# Patient Record
Sex: Male | Born: 1940 | Race: White | Hispanic: No | Marital: Married | Smoking: Former smoker
Health system: Southern US, Community
[De-identification: ages and names within clinical notes are randomized; demographics above are authoritative.]

## PROBLEM LIST (undated history)

## (undated) DIAGNOSIS — Z860100 Personal history of colon polyps, unspecified: Secondary | ICD-10-CM

## (undated) DIAGNOSIS — N529 Male erectile dysfunction, unspecified: Secondary | ICD-10-CM

## (undated) DIAGNOSIS — C61 Malignant neoplasm of prostate: Secondary | ICD-10-CM

## (undated) DIAGNOSIS — H1851 Endothelial corneal dystrophy: Secondary | ICD-10-CM

## (undated) DIAGNOSIS — L719 Rosacea, unspecified: Secondary | ICD-10-CM

## (undated) DIAGNOSIS — I10 Essential (primary) hypertension: Secondary | ICD-10-CM

## (undated) DIAGNOSIS — I340 Nonrheumatic mitral (valve) insufficiency: Secondary | ICD-10-CM

## (undated) DIAGNOSIS — K219 Gastro-esophageal reflux disease without esophagitis: Secondary | ICD-10-CM

## (undated) DIAGNOSIS — G309 Alzheimer's disease, unspecified: Secondary | ICD-10-CM

## (undated) DIAGNOSIS — F0280 Dementia in other diseases classified elsewhere without behavioral disturbance: Secondary | ICD-10-CM

## (undated) DIAGNOSIS — H18519 Endothelial corneal dystrophy, unspecified eye: Secondary | ICD-10-CM

## (undated) DIAGNOSIS — G250 Essential tremor: Secondary | ICD-10-CM

## (undated) DIAGNOSIS — G8929 Other chronic pain: Secondary | ICD-10-CM

## (undated) DIAGNOSIS — M47812 Spondylosis without myelopathy or radiculopathy, cervical region: Secondary | ICD-10-CM

## (undated) DIAGNOSIS — E7800 Pure hypercholesterolemia, unspecified: Secondary | ICD-10-CM

## (undated) DIAGNOSIS — M549 Dorsalgia, unspecified: Secondary | ICD-10-CM

## (undated) DIAGNOSIS — D649 Anemia, unspecified: Secondary | ICD-10-CM

## (undated) HISTORY — DX: Gastro-esophageal reflux disease without esophagitis: K21.9

## (undated) HISTORY — DX: Essential (primary) hypertension: I10

## (undated) HISTORY — DX: Endothelial corneal dystrophy: H18.51

## (undated) HISTORY — DX: Personal history of colon polyps, unspecified: Z86.0100

## (undated) HISTORY — DX: Alzheimer's disease, unspecified: G30.9

## (undated) HISTORY — DX: Endothelial corneal dystrophy, unspecified eye: H18.519

## (undated) HISTORY — DX: Male erectile dysfunction, unspecified: N52.9

## (undated) HISTORY — DX: Gilbert syndrome: E80.4

## (undated) HISTORY — DX: Essential tremor: G25.0

## (undated) HISTORY — DX: Spondylosis without myelopathy or radiculopathy, cervical region: M47.812

## (undated) HISTORY — DX: Dorsalgia, unspecified: M54.9

## (undated) HISTORY — DX: Pure hypercholesterolemia, unspecified: E78.00

## (undated) HISTORY — DX: Nonrheumatic mitral (valve) insufficiency: I34.0

## (undated) HISTORY — DX: Malignant neoplasm of prostate: C61

## (undated) HISTORY — DX: Anemia, unspecified: D64.9

## (undated) HISTORY — DX: Other chronic pain: G89.29

## (undated) HISTORY — DX: Rosacea, unspecified: L71.9

## (undated) HISTORY — DX: Dementia in other diseases classified elsewhere, unspecified severity, without behavioral disturbance, psychotic disturbance, mood disturbance, and anxiety: F02.80

## (undated) HISTORY — DX: Personal history of colonic polyps: Z86.010

---

## 1999-02-14 ENCOUNTER — Emergency Department (HOSPITAL_COMMUNITY): Admission: EM | Admit: 1999-02-14 | Discharge: 1999-02-14 | Payer: Self-pay | Admitting: Emergency Medicine

## 1999-07-05 ENCOUNTER — Other Ambulatory Visit: Admission: RE | Admit: 1999-07-05 | Discharge: 1999-07-05 | Payer: Self-pay | Admitting: Urology

## 1999-07-27 ENCOUNTER — Encounter: Admission: RE | Admit: 1999-07-27 | Discharge: 1999-10-25 | Payer: Self-pay | Admitting: Radiation Oncology

## 1999-11-19 ENCOUNTER — Encounter: Payer: Self-pay | Admitting: Orthopedic Surgery

## 1999-11-19 ENCOUNTER — Encounter: Admission: RE | Admit: 1999-11-19 | Discharge: 1999-11-19 | Payer: Self-pay | Admitting: Orthopedic Surgery

## 1999-12-16 ENCOUNTER — Encounter: Payer: Self-pay | Admitting: Neurosurgery

## 1999-12-16 ENCOUNTER — Ambulatory Visit (HOSPITAL_COMMUNITY): Admission: RE | Admit: 1999-12-16 | Discharge: 1999-12-16 | Payer: Self-pay | Admitting: Neurosurgery

## 1999-12-23 ENCOUNTER — Encounter: Payer: Self-pay | Admitting: Neurosurgery

## 1999-12-24 ENCOUNTER — Inpatient Hospital Stay (HOSPITAL_COMMUNITY): Admission: AD | Admit: 1999-12-24 | Discharge: 1999-12-25 | Payer: Self-pay | Admitting: Neurosurgery

## 2000-01-08 ENCOUNTER — Emergency Department (HOSPITAL_COMMUNITY): Admission: EM | Admit: 2000-01-08 | Discharge: 2000-01-08 | Payer: Self-pay | Admitting: Emergency Medicine

## 2000-02-16 ENCOUNTER — Encounter: Payer: Self-pay | Admitting: Neurosurgery

## 2000-02-16 ENCOUNTER — Encounter: Admission: RE | Admit: 2000-02-16 | Discharge: 2000-02-16 | Payer: Self-pay | Admitting: Neurosurgery

## 2000-02-28 ENCOUNTER — Encounter: Payer: Self-pay | Admitting: Neurosurgery

## 2000-02-28 ENCOUNTER — Encounter: Admission: RE | Admit: 2000-02-28 | Discharge: 2000-02-28 | Payer: Self-pay | Admitting: Neurosurgery

## 2000-03-27 ENCOUNTER — Ambulatory Visit (HOSPITAL_COMMUNITY): Admission: RE | Admit: 2000-03-27 | Discharge: 2000-03-27 | Payer: Self-pay | Admitting: Neurological Surgery

## 2000-04-06 ENCOUNTER — Inpatient Hospital Stay (HOSPITAL_COMMUNITY): Admission: RE | Admit: 2000-04-06 | Discharge: 2000-04-11 | Payer: Self-pay | Admitting: Neurological Surgery

## 2000-04-06 ENCOUNTER — Encounter: Payer: Self-pay | Admitting: Neurological Surgery

## 2000-05-09 ENCOUNTER — Encounter: Admission: RE | Admit: 2000-05-09 | Discharge: 2000-05-09 | Payer: Self-pay | Admitting: Orthopaedic Surgery

## 2000-05-09 ENCOUNTER — Encounter: Payer: Self-pay | Admitting: Orthopaedic Surgery

## 2000-05-12 ENCOUNTER — Encounter: Admission: RE | Admit: 2000-05-12 | Discharge: 2000-05-12 | Payer: Self-pay | Admitting: Neurological Surgery

## 2000-05-12 ENCOUNTER — Encounter: Payer: Self-pay | Admitting: Neurological Surgery

## 2000-06-21 ENCOUNTER — Encounter: Admission: RE | Admit: 2000-06-21 | Discharge: 2000-06-21 | Payer: Self-pay | Admitting: Neurological Surgery

## 2000-06-21 ENCOUNTER — Encounter: Payer: Self-pay | Admitting: Neurological Surgery

## 2000-06-27 ENCOUNTER — Encounter: Admission: RE | Admit: 2000-06-27 | Discharge: 2000-07-14 | Payer: Self-pay | Admitting: Neurosurgery

## 2000-09-13 ENCOUNTER — Encounter (INDEPENDENT_AMBULATORY_CARE_PROVIDER_SITE_OTHER): Payer: Self-pay | Admitting: Specialist

## 2000-09-13 ENCOUNTER — Other Ambulatory Visit: Admission: RE | Admit: 2000-09-13 | Discharge: 2000-09-13 | Payer: Self-pay | Admitting: Urology

## 2001-04-07 ENCOUNTER — Encounter: Payer: Self-pay | Admitting: Neurological Surgery

## 2001-04-07 ENCOUNTER — Ambulatory Visit (HOSPITAL_COMMUNITY): Admission: RE | Admit: 2001-04-07 | Discharge: 2001-04-07 | Payer: Self-pay | Admitting: Neurological Surgery

## 2001-07-09 ENCOUNTER — Encounter (INDEPENDENT_AMBULATORY_CARE_PROVIDER_SITE_OTHER): Payer: Self-pay | Admitting: *Deleted

## 2001-07-09 ENCOUNTER — Other Ambulatory Visit: Admission: RE | Admit: 2001-07-09 | Discharge: 2001-07-09 | Payer: Self-pay | Admitting: Urology

## 2002-03-27 ENCOUNTER — Encounter: Admission: RE | Admit: 2002-03-27 | Discharge: 2002-03-27 | Payer: Self-pay | Admitting: Geriatric Medicine

## 2002-03-27 ENCOUNTER — Encounter: Payer: Self-pay | Admitting: Geriatric Medicine

## 2005-01-17 ENCOUNTER — Ambulatory Visit: Payer: Self-pay | Admitting: Gastroenterology

## 2005-02-01 ENCOUNTER — Ambulatory Visit: Payer: Self-pay | Admitting: Gastroenterology

## 2005-02-14 ENCOUNTER — Encounter: Admission: RE | Admit: 2005-02-14 | Discharge: 2005-02-14 | Payer: Self-pay | Admitting: Geriatric Medicine

## 2010-09-23 ENCOUNTER — Encounter: Admission: RE | Admit: 2010-09-23 | Discharge: 2010-09-23 | Payer: Self-pay | Admitting: Neurological Surgery

## 2011-05-13 NOTE — Discharge Summary (Signed)
[place]. [facility]  Patient:    George Garrett                         MRN: [id number] Adm. Date:  [id number] Disc. Date: [id number] Attending:  Jackelyn Knife                           Discharge Summary  HISTORY OF PRESENT ILLNESS:  Mr. George Garrett. 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  This became progressively more severe and extended back over a history of several months.  HOSPITAL COURSE:  He had a lumbosacral MRI study which demonstrated lateral stenosis on the right at L3/4 as well as a laterally situated soft disc herniation.   After informing preoperative workup including the explanation of the goals and risks of the procedure, he was taken to the operating room on December 23, 1999. He underwent a right L3-L4 hemilaminectomy and microdiskectomy.  He did well postoperatively.  He did have some trouble voiding and had to be catheterized on two occasions and this prolonged his hospital stay by one day.  FINAL DIAGNOSES: 1. Herniated intervertebral disc right lumbar vertebrae-3/4 2. Lateral spinal stenosis right lumbar vertebrae-3/4.  CONDITION ON DISCHARGE:  Improving.  DISCHARGE INSTRUCTIONS:  Up ad-lib.  Regular diet.  Back to see me in seven days.  DISCHARGE MEDICATION:  Vicodin ES 1 p.o. q.4-6h. p.r.n. pain. DD:  01/20/00 TD:  01/20/00 Job: [id number]

## 2011-05-13 NOTE — H&P (Signed)
[place]. [facility]  Patient:    George Garrett                         MRN: [id number] Adm. Date:  [id number] Attending:  Jackelyn Knife                         History and Physical  HISTORY:  George Garrett is a generally healthy 70 year old male, who presents  with a chief complaint of low back pain with radiation of pain down his right leg, primarily into the thigh.  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  Four months or so ago, he noted low back pain with some early beginnings of right leg pain hat centered around the thigh and knee.  Over the last several weeks, this has become more and more of a problem, in spite of reduced activity, heat, and analgesics.  The right leg pain at this point overshadows the amount of back pain that he is  having.  Both are made worse with activity, as well as by coughing, sneezing, or straining at bowel movement.  Normally, [name] is extremely physically active, but over the last month to ix weeks, he has done almost nothing because of the amount of pain that is produced by activity.  He was seen in consultation by Dr. Charlett Blake of orthopedics, and as part of that workup had an MRI study that demonstrated spondylosis, but also an L3-L4 disk herniation.  PAST MEDICAL HISTORY:  Pertinent in that he has some high blood pressure that had been treated effectively.  Also has a low grade prostate tumor that is being treated and followed by Dr. Darvin Neighbours.  FAMILY HISTORY:  Shows that his mother is living at age 21 with high blood pressure and Alzheimers.  His father expired per stomach cancer.  He has a 63 year old brother living and in good health, and two sister in their mid 2s living, both of them have high blood pressure.  He has two children, ages 33 and 52, living and in good health.  PREVIOUS SURGERY:  Consists of a knee operation done by Dr. Madelon Lips via  scope in 1999.  CURRENT MEDICATIONS:  Lipitor, Inderal, ______, Prinivil.  ALLERGIES:  He has no medical allergies.  SOCIAL HISTORY:  He does not smoke, does use alcohol very moderately.  REVIEW OF SYSTEMS:  Is pertinent as already mentioned.  PHYSICAL EXAMINATION:  GENERAL:  He is a healthy-appearing individual.  VITAL SIGNS:  He weighs 163 pounds and is 5 feet 10 inches tall.  Blood pressure is 147/83, pulse 63, temperature 96.7.  HEENT:  Normal.  NECK:  He has no cervical adenopathy and no cervical or supraclavicular bruits.  CHEST:  Clear.  CARDIAC:  Reveals no enlargement or bruits.  ABDOMEN:  Soft and nontender with no palpable masses.  NEUROLOGIC:  Reveals his cranial nerves to be intact.  Range of motion is limited on forward bending because of increased back and right leg pain.  He has no adenopathy or mass lesions in the back, buttocks, or legs.  He is able to walk n the heel and toe without obvious weakness, but he has difficulty doing deep knee bends because of some quadriceps weakness on the right.  This weakness can be brought out with stepping maneuvers on the right compared to the left.  He has  depressed right knee  reflex.  IMPRESSION:  Herniated disk, L3-4, right. DD:  12/23/99 TD:  12/24/99 Job: [id number]

## 2011-05-13 NOTE — Discharge Summary (Signed)
[place]. [facility]  Patient:    George Garrett, George Garrett                        MRN: [id number] Adm. Date:  [id number] Disc. Date: 04/11/00 Attending:  Jonne Ply                           Discharge Summary  ADMITTING DIAGNOSIS: Recurrent herniated nucleus pulposus, L3-4, with lumbar radiculopathy and severe spondylosis.  DISCHARGE DIAGNOSIS: Recurrent herniated nucleus pulposus, L3-4, with lumbar radiculopathy and severe spondylosis.  OPERATION/PROCEDURE: L3-4 bilateral laminotomy and diskectomy, posterior interbody arthrodesis with allograft, supplemental fixation with pedicle screws, and iliac crest bone graft at L3-4, surgeon Dr. Danielle Dess, first assistant Dr. Lovell Sheehan, performed on April 06, 2000.  DISCHARGE CONDITION: Improved.  HOSPITAL COURSE: The patient is a 70 year old individual who has had significant back and leg pain, found to have recurrent herniated nucleus pulposus and admitted to the hospital to undergo decompression in addition to stabilization as there was note of significant spondylitic degeneration at the L3-4 space.  The procedure was completed without difficulty on April 06, 2000 and postoperatively the patient had significant difficulties with urinary retention, which required placement of a urinary catheter for a period of 72 hours postoperatively.  The patient was gradually mobilized.  The incision has remained clean and dry.  His leg strength is improving and his radiculopathy is better.  He is still requiring Percocet, using up to eight per day, in addition to Xanax as a muscle relaxant.  FOLLOW-UP: He will seen in the office in approximately three to four weeks time for further follow-up. DD:  04/11/00 TD:  04/11/00 Job: [id number]

## 2011-05-13 NOTE — H&P (Signed)
[place]. [facility]  Patient:    George Garrett, George Garrett                       MRN: [id number] Adm. Date:  04/06/00 Attending:  Stefani Dama, M.D.                         History and Physical  ADMITTING DIAGNOSIS:  Recurrent herniated nucleus pulposus, L3-4, with severe right lumbar radiculopathy.  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  He noticed that a severe pain occurred in the sciatic distribution in the fall of 2000, with back pain that started rather insidiously and the leg pain radiating down the left lateral aspect of the leg into the region of his knee.  He was found to have a herniated nucleus pulposus at L3-4 and pain gradually settled on the right side.  He had a right lumbar microdiscectomy on 12/23/99 by Dr. Emilee Hero.  He was okay for the first several weeks, but subsequently developed a rather severe recrudescence of his right lower extremity.  The pain had progressively gotten worse and postoperatively an MRI showed a (re)herniation of the same disc.  He had significant collapse of the disc space on the right side, at the L3-4 level.  He developed pain in the region of the right flank and complains now of pain in the entire right lower extremity, radiating through the shin, calf and into the ankle.  Bowel and bladder control have not een affected.  He was seen in follow-up and had postoperative MRI that showed recurrence of the disc herniation in addition to significant sclerotic changes n the disc space.  Dr. Elesa Hacker recommended further surgery.  I had seen the patient on 03/22/00 for a second opinion.  I advised that he should proceed with surgical decompression and stabilization.  The patient is now admitted for this procedure under my care.  PAST MEDICAL HISTORY:  Reveals he has high blood pressure.  He has difficulties  with prostate  cancer.  He had a history of high cholesterol.  ALLERGIES:  He notes an allergy to medications.  HABITS:  He does not smoke.  He drinks alcohol on a daily basis.  His height and weight have been stable at 162 pounds.  Height 5 ft 9 in.  FAMILY HISTORY:  Mother is age 40 and in good health but she has Alzheimers. Father is deceased; no significant history is otherwise known.  There is, however, a family history of high blood pressure and Alzheimers disease, stomach cancer nd emphysema.  PAST SURGICAL HISTORY:  Herniated disc on 12/23/99.  Right knee surgery, July, 1999.  Some ligament repair in December, 1997.  CURRENT MEDICATIONS:  Lipitor, Prinivil, hydrochlorothiazide, Norvasc, Inderal,  Colace, Voltaren and Vicodin ES up to four per day for control of pain.  SYSTEMS REVIEW:  Notable for the wearing of glasses.  Use of a hearing aid. Hearing loss.  High blood pressure.  High cholesterol.  Pain pain while walking. Leg pain and weakness.  Back pain.  History of prostate cancer.  PHYSICAL EXAMINATION:  NEUROLOGIC:  He stands straight and erect without difficulty.  He flexes forward a maximum of 10-15 degrees.  His back reveals a marked amount of paravertebral spasm, particularly on the right side, with tenderness to palpation in the region on the right side in the paravertebral  lumbar musculature.  Motion, particularly flexion, worsens his pain.  Motor strength in the lower extremities reveals the iliopsoas quadriceps to be _____ anterior, have good strength to confrontation though there is modest atrophy in the right quad, noted at this time.  Deep tendon reflexes absent in the right patellae, 2+ in the left patellae, 2+ in both Achilles, Babinskis downgoing.  Straight leg raising is markedly positive on the right side at 30 degrees and negative on the left side to 80 degrees.  Patricks maneuver is positive on the right side for back pain and leg pain; negative on the  left side. Sensation is diminished at the level of the knee to pin and vibration.  Cranial  nerve exam reveals that the pupils are 4 mm, briskly reactive to light and accommodation; extraocular movements are full.  The face is symmetric to grimace. Tongue and uvula are in the midline.  Sclerae and conjunctivae are clear.  LUNGS:  Clear to auscultation.  HEART:  Regular rate and rhythm.  ABDOMEN:  Soft, bowel sounds are positive.  No masses are palpable.  EXTREMITIES:  Reveal no cyanosis, clubbing or edema.  IMPRESSION:  The patient has a recurrent herniated nucleus pulposus at L3-4 with a right lumbar radiculopathy.  PLAN:  He is now being admitted to undergo repeat discectomy for ______ decompression and stabilization of his lumbar spine. DD:  04/06/00 TD:  04/06/00 Job: [id number]

## 2011-05-13 NOTE — Op Note (Signed)
[place]. [facility]  Patient:    George Garrett                         MRN: [id number] Proc. Date: 12/23/99 Adm. 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  POSTOPERATIVE DIAGNOSES: 1. 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 2. Spondylosis and lateral recess stenosis.  OPERATION:  Right L3-L4 hemilaminotomy, foraminotomy, and discectomy.  SURGEON:  Izell Linwood. Elesa Hacker, M.D.  ASSISTANT:  Clydene Fake, M.D.  DESCRIPTION OF PROCEDURE:  Under general endotracheal anesthesia, the usual subperiosteal approach was carried out to the right L3-L4 intralaminar space. Using the operating microscope and high speed drill, a hemilaminotomy was performed.  Because of spondylitic overgrowth and thickened ligaments, there was noted to be an element of spinal stenosis.  These hypertrophied and calcified ligaments and also bone spurs were removed and the intervertebral disc space was identified and noted to be herniated.  The annulus was incised and a large quantity of degenerative disc disease material was removed including a large piece that was situated laterally which may have een partially extruded.  This afforded excellent decompression of the dural sac and  nerve root.  The wound was copiously irrigated with antibiotic solution and then closed in layers.  A sterile dressing was applied and the patient was taken to the recovery room in good condition. DD:  12/23/99 TD:  12/25/99 Job: [id number]

## 2012-01-18 ENCOUNTER — Encounter: Payer: Self-pay | Admitting: Internal Medicine

## 2012-09-13 ENCOUNTER — Encounter: Payer: Self-pay | Admitting: Internal Medicine

## 2012-10-04 ENCOUNTER — Encounter: Payer: Self-pay | Admitting: Gastroenterology

## 2014-08-09 ENCOUNTER — Encounter: Payer: Self-pay | Admitting: *Deleted

## 2015-08-17 ENCOUNTER — Encounter (HOSPITAL_COMMUNITY): Payer: Self-pay | Admitting: Emergency Medicine

## 2015-08-17 ENCOUNTER — Emergency Department (HOSPITAL_COMMUNITY)
Admission: EM | Admit: 2015-08-17 | Discharge: 2015-08-17 | Disposition: A | Discharge status: Home / Self Care | Payer: Medicare PPO | Attending: Emergency Medicine | Admitting: Emergency Medicine

## 2015-08-17 ENCOUNTER — Emergency Department (HOSPITAL_COMMUNITY): Payer: Medicare PPO

## 2015-08-17 DIAGNOSIS — Z8719 Personal history of other diseases of the digestive system: Secondary | ICD-10-CM | POA: Insufficient documentation

## 2015-08-17 DIAGNOSIS — Z7982 Long term (current) use of aspirin: Secondary | ICD-10-CM | POA: Insufficient documentation

## 2015-08-17 DIAGNOSIS — Y9289 Other specified places as the place of occurrence of the external cause: Secondary | ICD-10-CM | POA: Diagnosis not present

## 2015-08-17 DIAGNOSIS — G8929 Other chronic pain: Secondary | ICD-10-CM | POA: Diagnosis not present

## 2015-08-17 DIAGNOSIS — Z87438 Personal history of other diseases of male genital organs: Secondary | ICD-10-CM | POA: Insufficient documentation

## 2015-08-17 DIAGNOSIS — G309 Alzheimer's disease, unspecified: Secondary | ICD-10-CM | POA: Insufficient documentation

## 2015-08-17 DIAGNOSIS — Y9389 Activity, other specified: Secondary | ICD-10-CM | POA: Insufficient documentation

## 2015-08-17 DIAGNOSIS — S80811A Abrasion, right lower leg, initial encounter: Secondary | ICD-10-CM | POA: Insufficient documentation

## 2015-08-17 DIAGNOSIS — I10 Essential (primary) hypertension: Secondary | ICD-10-CM | POA: Diagnosis not present

## 2015-08-17 DIAGNOSIS — R001 Bradycardia, unspecified: Secondary | ICD-10-CM | POA: Insufficient documentation

## 2015-08-17 DIAGNOSIS — Y998 Other external cause status: Secondary | ICD-10-CM | POA: Diagnosis not present

## 2015-08-17 DIAGNOSIS — S40812A Abrasion of left upper arm, initial encounter: Secondary | ICD-10-CM | POA: Diagnosis not present

## 2015-08-17 DIAGNOSIS — W01198A Fall on same level from slipping, tripping and stumbling with subsequent striking against other object, initial encounter: Secondary | ICD-10-CM | POA: Diagnosis not present

## 2015-08-17 DIAGNOSIS — Z86010 Personal history of colonic polyps: Secondary | ICD-10-CM | POA: Diagnosis not present

## 2015-08-17 DIAGNOSIS — Z79899 Other long term (current) drug therapy: Secondary | ICD-10-CM | POA: Diagnosis not present

## 2015-08-17 DIAGNOSIS — S80812A Abrasion, left lower leg, initial encounter: Secondary | ICD-10-CM | POA: Insufficient documentation

## 2015-08-17 DIAGNOSIS — S40811A Abrasion of right upper arm, initial encounter: Secondary | ICD-10-CM | POA: Diagnosis not present

## 2015-08-17 DIAGNOSIS — Z8546 Personal history of malignant neoplasm of prostate: Secondary | ICD-10-CM | POA: Diagnosis not present

## 2015-08-17 DIAGNOSIS — Z872 Personal history of diseases of the skin and subcutaneous tissue: Secondary | ICD-10-CM | POA: Insufficient documentation

## 2015-08-17 DIAGNOSIS — Z862 Personal history of diseases of the blood and blood-forming organs and certain disorders involving the immune mechanism: Secondary | ICD-10-CM | POA: Insufficient documentation

## 2015-08-17 DIAGNOSIS — Z87891 Personal history of nicotine dependence: Secondary | ICD-10-CM | POA: Diagnosis not present

## 2015-08-17 DIAGNOSIS — F0280 Dementia in other diseases classified elsewhere without behavioral disturbance: Secondary | ICD-10-CM | POA: Diagnosis not present

## 2015-08-17 DIAGNOSIS — W19XXXA Unspecified fall, initial encounter: Secondary | ICD-10-CM

## 2015-08-17 NOTE — ED Notes (Signed)
Pt came from a SNF where he was standing while holding a chair and fell. Pt hit his head and the SNF sent him to ED for eval. Family at bedside and stated he is acting per his normal. No distress noted.

## 2015-08-17 NOTE — ED Provider Notes (Signed)
CSN: [id number]     Arrival date & time 08/17/15  9024 History   First MD Initiated Contact with Patient 08/17/15 [phone]     Chief Complaint  Patient presents with  . Fall     (Consider location/radiation/quality/duration/timing/severity/associated sxs/prior Treatment) Patient is a 74 y.o. male presenting with fall. The history is provided by the patient.  Fall This is a new problem. The current episode started less than 1 hour ago. The problem occurs constantly. The problem has not changed since onset.Pertinent negatives include no chest pain, no abdominal pain, no headaches and no shortness of breath. Nothing aggravates the symptoms. Nothing relieves the symptoms. He has tried nothing for the symptoms. The treatment provided no relief.   74 yo M with a chief complaint of fall. Per the family he has been doing this more more often or he starts looking around the room becomes confused bends over and then falls out of his chair. Happened earlier today was witnessed by the nursing home staff denies syncope. Patient pleasantly demented and refuses areas of pain.  Moving all 4 extremities mild abrasion to the posterior occiput otherwise no signs of trauma.  Past Medical History  Diagnosis Date  . Hypertension   . Chronic back pain   . Benign familial tremor   . Normocytic normochromic anemia   . Prostate cancer   . Hypercholesteremia   . Fuchs' corneal dystrophy   . Mild mitral regurgitation   . ED (erectile dysfunction)   . Cervical spondylosis   . Gilbert's syndrome   . History of colon polyps   . Rosacea   . Alzheimer's dementia   . GERD (gastroesophageal reflux disease)    History reviewed. No pertinent past surgical history. Family History  Problem Relation Age of Onset  . Heart attack Mother   . Alzheimer's disease Mother   . Hypertension Sister   . Hypertension Brother   . Alzheimer's disease Maternal Aunt   . Alzheimer's disease Maternal Grandmother    Social History   Substance Use Topics  . Smoking status: Former Smoker    Quit date: 12/26/1993  . Smokeless tobacco: None  . Alcohol Use: 4.2 oz/week    7 Glasses of wine per week    Review of Systems  Constitutional: Negative for fever and chills.  HENT: Negative for congestion and facial swelling.   Eyes: Negative for discharge and visual disturbance.  Respiratory: Negative for shortness of breath.   Cardiovascular: Negative for chest pain and palpitations.  Gastrointestinal: Negative for vomiting, abdominal pain and diarrhea.  Musculoskeletal: Negative for myalgias and arthralgias.  Skin: Negative for color change and rash.  Neurological: Negative for tremors, syncope and headaches.  Psychiatric/Behavioral: Negative for confusion and dysphoric mood.      Allergies  Aricept; Crestor; Flomax; Lipitor; Maxzide; Pravachol; Sertraline; Simvastatin; Welchol; and Zetia  Home Medications   Prior to Admission medications   Medication Sig Start Date End Date Taking? Authorizing Provider  acetaminophen (TYLENOL) 500 MG tablet Take 1,000 mg by mouth daily as needed for mild pain or moderate pain.   Yes Historical Provider, MD  amLODipine (NORVASC) 5 MG tablet Take 5 mg by mouth daily.   Yes Historical Provider, MD  aspirin 81 MG tablet Take 81 mg by mouth daily.   Yes Historical Provider, MD  divalproex (DEPAKOTE SPRINKLE) 125 MG capsule Take 250-375 mg by mouth 2 (two) times daily. Take 250 mg by mouth every morning  & 375 mg by mouth every evening  Yes Historical Provider, MD  escitalopram (LEXAPRO) 20 MG tablet Take 20 mg by mouth daily.   Yes Historical Provider, MD  lisinopril (PRINIVIL,ZESTRIL) 10 MG tablet Take 10 mg by mouth 2 (two) times daily.   Yes Historical Provider, MD  LORazepam (ATIVAN) 0.5 MG tablet Take 0.5 mg by mouth 2 (two) times daily.    Yes Historical Provider, MD  Memantine HCl ER (NAMENDA XR) 28 MG CP24 Take 28 mg by mouth daily.    Yes Historical Provider, MD  metoprolol  tartrate (LOPRESSOR) 25 MG tablet Take 25 mg by mouth 2 (two) times daily.   Yes Historical Provider, MD  polyethylene glycol (MIRALAX / GLYCOLAX) packet Take 17 g by mouth daily as needed for mild constipation or moderate constipation.    Yes Historical Provider, MD  QUEtiapine (SEROQUEL) 25 MG tablet Take 25 mg by mouth at bedtime.   Yes Historical Provider, MD  silodosin (RAPAFLO) 8 MG CAPS capsule Take 8 mg by mouth daily.    Yes Historical Provider, MD   BP 133/59 mmHg  Pulse 45  Temp(Src) 97.7 F (36.5 C) (Oral)  Resp 14  SpO2 100% Physical Exam  Constitutional: He appears well-developed and well-nourished.  HENT:  Head: Normocephalic and atraumatic.  Eyes: EOM are normal. Pupils are equal, round, and reactive to light.  Neck: Normal range of motion. Neck supple. No JVD present.  Cardiovascular: Regular rhythm.  Bradycardia present.  Exam reveals no gallop and no friction rub.   No murmur heard. Pulmonary/Chest: No respiratory distress. He has no wheezes.  Abdominal: He exhibits no distension. There is no rebound and no guarding.  Musculoskeletal: Normal range of motion.  Neurological: He is alert. GCS eye subscore is 4. GCS verbal subscore is 4. GCS motor subscore is 6.  Skin: No rash noted. No pallor.  Psychiatric: He has a normal mood and affect. His behavior is normal.    ED Course  Procedures (including critical care time) Labs Review Labs Reviewed - No data to display  Imaging Review Ct Head Wo Contrast  08/17/2015   CLINICAL DATA:  Fall, head trauma. Vertex scalp laceration. Alzheimer's disease.  EXAM: CT HEAD WITHOUT CONTRAST  CT CERVICAL SPINE WITHOUT CONTRAST  TECHNIQUE: Multidetector CT imaging of the head and cervical spine was performed following the standard protocol without intravenous contrast. Multiplanar CT image reconstructions of the cervical spine were also generated.  COMPARISON:  Cervical spine MRI 06/25/2010.  FINDINGS: CT HEAD FINDINGS  Mild cortical  volume loss noted with proportional ventricular prominence. Areas of periventricular white matter hypodensity are most compatible with small vessel ischemic change. No acute hemorrhage, infarct, or mass lesion is identified. No skull fracture. Orbits and paranasal sinuses are unremarkable.  CT CERVICAL SPINE FINDINGS  Minimal anterior wedging at T1 is stable. No fracture or dislocation. No precervical soft tissue widening. Normal alignment. Minimal disc degenerative change is identified at multiple levels, producing mild impingement upon the neural foramina by uncovertebral joint hypertrophy/ spurring from C3 through C7 as well as facet osteoarthritic change at these levels. Multilevel disc degenerative change noted with loss of disc space and vacuum disc phenomenon at C4-C5, C5-C6, and C6-C7.  IMPRESSION: No acute intracranial abnormality.  Mild mid cervical degenerative change without fracture or dislocation.   Electronically Signed   By: Conchita Paris M.D.   On: 08/17/2015 10:29   Ct Cervical Spine Wo Contrast  08/17/2015   CLINICAL DATA:  Fall, head trauma. Vertex scalp laceration. Alzheimer's disease.  EXAM: CT  HEAD WITHOUT CONTRAST  CT CERVICAL SPINE WITHOUT CONTRAST  TECHNIQUE: Multidetector CT imaging of the head and cervical spine was performed following the standard protocol without intravenous contrast. Multiplanar CT image reconstructions of the cervical spine were also generated.  COMPARISON:  Cervical spine MRI 06/25/2010.  FINDINGS: CT HEAD FINDINGS  Mild cortical volume loss noted with proportional ventricular prominence. Areas of periventricular white matter hypodensity are most compatible with small vessel ischemic change. No acute hemorrhage, infarct, or mass lesion is identified. No skull fracture. Orbits and paranasal sinuses are unremarkable.  CT CERVICAL SPINE FINDINGS  Minimal anterior wedging at T1 is stable. No fracture or dislocation. No precervical soft tissue widening. Normal  alignment. Minimal disc degenerative change is identified at multiple levels, producing mild impingement upon the neural foramina by uncovertebral joint hypertrophy/ spurring from C3 through C7 as well as facet osteoarthritic change at these levels. Multilevel disc degenerative change noted with loss of disc space and vacuum disc phenomenon at C4-C5, C5-C6, and C6-C7.  IMPRESSION: No acute intracranial abnormality.  Mild mid cervical degenerative change without fracture or dislocation.   Electronically Signed   By: Conchita Paris M.D.   On: 08/17/2015 10:29   I have personally reviewed and evaluated these images and lab results as part of my medical decision-making.   EKG Interpretation   Date/Time:  Monday August 17 2015 09:36:09 EDT Ventricular Rate:  43 PR Interval:  168 QRS Duration: 118 QT Interval:  493 QTC Calculation: 417 R Axis:   -11 Text Interpretation:  Sinus bradycardia Incomplete right bundle branch  block No significant change since last tracing Confirmed by Torianne Laflam MD,  Quillian Quince ([id number]) on 08/17/2015 10:03:47 AM      MDM   Final diagnoses:  Fall, initial encounter    74 yo M with a chief complaint of a fall. Appears to be mechanical by history. Patient currently at baseline according to family. CT head C-spine. EKG without significant changes from prior.  CT head and c spine unremarkable and viewed by me.  Patient continues to be at baseline.  Will d/c home.   I have discussed the diagnosis/risks/treatment options with the patient and family and believe the pt to be eligible for discharge home to follow-up with PCP. We also discussed returning to the ED immediately if new or worsening sx occur. We discussed the sx which are most concerning (e.g., sudden neuro deficit) that necessitate immediate return. Medications administered to the patient during their visit and any new prescriptions provided to the patient are listed below.  Medications given during this  visit Medications - No data to display  Discharge Medication List as of 08/17/2015 10:36 AM       The patient appears reasonably screen and/or stabilized for discharge and I doubt any other medical condition or other [facility] requiring further screening, evaluation, or treatment in the ED at this time prior to discharge.    Deno Etienne, DO 08/17/15 1756

## 2015-08-17 NOTE — Discharge Instructions (Signed)

## 2015-08-17 NOTE — ED Notes (Signed)
Awake. Verbally responsive. A/O x1. Resp even and unlabored. No audible adventitious breath sounds noted. ABC's intact.  

## 2015-08-17 NOTE — ED Notes (Signed)
Bed: YK99 Expected date:  Expected time:  Means of arrival:  Comments: EMS- 74yo M, fall/hit head, no thinners

## 2015-09-01 ENCOUNTER — Encounter (HOSPITAL_COMMUNITY): Payer: Self-pay | Admitting: *Deleted

## 2015-09-01 ENCOUNTER — Emergency Department (HOSPITAL_COMMUNITY)
Admission: EM | Admit: 2015-09-01 | Discharge: 2015-09-01 | Disposition: A | Discharge status: Home / Self Care | Payer: Medicare PPO | Attending: Emergency Medicine | Admitting: Emergency Medicine

## 2015-09-01 ENCOUNTER — Emergency Department (HOSPITAL_COMMUNITY): Payer: Medicare PPO

## 2015-09-01 DIAGNOSIS — Y92128 Other place in nursing home as the place of occurrence of the external cause: Secondary | ICD-10-CM | POA: Insufficient documentation

## 2015-09-01 DIAGNOSIS — W1839XA Other fall on same level, initial encounter: Secondary | ICD-10-CM | POA: Insufficient documentation

## 2015-09-01 DIAGNOSIS — I10 Essential (primary) hypertension: Secondary | ICD-10-CM | POA: Insufficient documentation

## 2015-09-01 DIAGNOSIS — Z87891 Personal history of nicotine dependence: Secondary | ICD-10-CM | POA: Insufficient documentation

## 2015-09-01 DIAGNOSIS — S0081XA Abrasion of other part of head, initial encounter: Secondary | ICD-10-CM | POA: Diagnosis not present

## 2015-09-01 DIAGNOSIS — Z8719 Personal history of other diseases of the digestive system: Secondary | ICD-10-CM | POA: Insufficient documentation

## 2015-09-01 DIAGNOSIS — Y998 Other external cause status: Secondary | ICD-10-CM | POA: Insufficient documentation

## 2015-09-01 DIAGNOSIS — F0280 Dementia in other diseases classified elsewhere without behavioral disturbance: Secondary | ICD-10-CM | POA: Diagnosis not present

## 2015-09-01 DIAGNOSIS — Z8546 Personal history of malignant neoplasm of prostate: Secondary | ICD-10-CM | POA: Insufficient documentation

## 2015-09-01 DIAGNOSIS — S0990XA Unspecified injury of head, initial encounter: Secondary | ICD-10-CM

## 2015-09-01 DIAGNOSIS — Z79899 Other long term (current) drug therapy: Secondary | ICD-10-CM | POA: Insufficient documentation

## 2015-09-01 DIAGNOSIS — Y9389 Activity, other specified: Secondary | ICD-10-CM | POA: Diagnosis not present

## 2015-09-01 DIAGNOSIS — Z86010 Personal history of colonic polyps: Secondary | ICD-10-CM | POA: Diagnosis not present

## 2015-09-01 DIAGNOSIS — W19XXXA Unspecified fall, initial encounter: Secondary | ICD-10-CM

## 2015-09-01 DIAGNOSIS — Z862 Personal history of diseases of the blood and blood-forming organs and certain disorders involving the immune mechanism: Secondary | ICD-10-CM | POA: Diagnosis not present

## 2015-09-01 DIAGNOSIS — S50812A Abrasion of left forearm, initial encounter: Secondary | ICD-10-CM | POA: Diagnosis not present

## 2015-09-01 DIAGNOSIS — G8929 Other chronic pain: Secondary | ICD-10-CM | POA: Diagnosis not present

## 2015-09-01 DIAGNOSIS — T148XXA Other injury of unspecified body region, initial encounter: Secondary | ICD-10-CM

## 2015-09-01 DIAGNOSIS — Z8639 Personal history of other endocrine, nutritional and metabolic disease: Secondary | ICD-10-CM | POA: Diagnosis not present

## 2015-09-01 DIAGNOSIS — Z7982 Long term (current) use of aspirin: Secondary | ICD-10-CM | POA: Diagnosis not present

## 2015-09-01 DIAGNOSIS — Z8739 Personal history of other diseases of the musculoskeletal system and connective tissue: Secondary | ICD-10-CM | POA: Insufficient documentation

## 2015-09-01 DIAGNOSIS — G309 Alzheimer's disease, unspecified: Secondary | ICD-10-CM | POA: Diagnosis not present

## 2015-09-01 DIAGNOSIS — Z872 Personal history of diseases of the skin and subcutaneous tissue: Secondary | ICD-10-CM | POA: Insufficient documentation

## 2015-09-01 NOTE — ED Notes (Signed)
Bed: WA21 Expected date:  Expected time:  Means of arrival:  Comments: 

## 2015-09-01 NOTE — ED Notes (Signed)
Patient transported to CT 

## 2015-09-01 NOTE — ED Provider Notes (Signed)
CSN: [id number]     Arrival date & time 09/01/15  1048 History   First MD Initiated Contact with Patient 09/01/15 1108     Chief Complaint  Patient presents with  . Fall     (Consider location/radiation/quality/duration/timing/severity/associated sxs/prior Treatment) HPI Comments: Level 5 caveat Ems reports that pt fell at nursing facility today. usure of how he fell.per nursing home pt is at his baseline. Pt was ambulatory at the scene. Abrasion to the left arm is not new. Abrasion to the forehead is new  The history is provided by the EMS personnel. No language interpreter was used.    Past Medical History  Diagnosis Date  . Hypertension   . Chronic back pain   . Benign familial tremor   . Normocytic normochromic anemia   . Prostate cancer   . Hypercholesteremia   . Fuchs' corneal dystrophy   . Mild mitral regurgitation   . ED (erectile dysfunction)   . Cervical spondylosis   . Gilbert's syndrome   . History of colon polyps   . Rosacea   . Alzheimer's dementia   . GERD (gastroesophageal reflux disease)    History reviewed. No pertinent past surgical history. Family History  Problem Relation Age of Onset  . Heart attack Mother   . Alzheimer's disease Mother   . Hypertension Sister   . Hypertension Brother   . Alzheimer's disease Maternal Aunt   . Alzheimer's disease Maternal Grandmother    Social History  Substance Use Topics  . Smoking status: Former Smoker    Quit date: 12/26/1993  . Smokeless tobacco: None  . Alcohol Use: 4.2 oz/week    7 Glasses of wine per week    Review of Systems  Unable to perform ROS: Dementia      Allergies  Aricept; Crestor; Flomax; Lipitor; Maxzide; Pravachol; Sertraline; Simvastatin; Welchol; and Zetia  Home Medications   Prior to Admission medications   Medication Sig Start Date End Date Taking? Authorizing Provider  acetaminophen (TYLENOL) 500 MG tablet Take 1,000 mg by mouth daily as needed for mild pain or moderate  pain.   Yes Historical Provider, MD  amLODipine (NORVASC) 5 MG tablet Take 5 mg by mouth 2 (two) times daily.    Yes Historical Provider, MD  aspirin 81 MG tablet Take 81 mg by mouth daily.   Yes Historical Provider, MD  divalproex (DEPAKOTE SPRINKLE) 125 MG capsule Take 250-375 mg by mouth 2 (two) times daily. Take 250 mg by mouth every morning  & 375 mg by mouth every evening   Yes Historical Provider, MD  escitalopram (LEXAPRO) 20 MG tablet Take 20 mg by mouth daily.   Yes Historical Provider, MD  lisinopril (PRINIVIL,ZESTRIL) 10 MG tablet Take 10 mg by mouth 2 (two) times daily.   Yes Historical Provider, MD  LORazepam (ATIVAN) 0.5 MG tablet Take 0.5 mg by mouth 2 (two) times daily.    Yes Historical Provider, MD  Memantine HCl ER (NAMENDA XR) 28 MG CP24 Take 28 mg by mouth daily.    Yes Historical Provider, MD  metoprolol tartrate (LOPRESSOR) 25 MG tablet Take 25 mg by mouth 2 (two) times daily.   Yes Historical Provider, MD  polyethylene glycol (MIRALAX / GLYCOLAX) packet Take 17 g by mouth daily as needed for mild constipation or moderate constipation.    Yes Historical Provider, MD  QUEtiapine (SEROQUEL) 25 MG tablet Take 25 mg by mouth at bedtime.   Yes Historical Provider, MD  silodosin (RAPAFLO) 8 MG CAPS  capsule Take 8 mg by mouth daily.    Yes Historical Provider, MD   BP 149/74 mmHg  Pulse 45  Temp(Src) 97.4 F (36.3 C) (Oral)  Resp 16  SpO2 100% Physical Exam  Constitutional: He appears well-developed and well-nourished.  HENT:  Right Ear: External ear normal.  Left Ear: External ear normal.  Abrasion to the forehead  Eyes: Conjunctivae and EOM are normal. Pupils are equal, round, and reactive to light.  Pulmonary/Chest: Effort normal and breath sounds normal.  Abdominal: Soft. Bowel sounds are normal. There is no tenderness.  Musculoskeletal: Normal range of motion.  Moving all extremities without any problems. Pt is ambulatory  Neurological: He is alert. He exhibits  normal muscle tone. Coordination normal.  Skin:  Abrasion to the left forearm  Nursing note and vitals reviewed.   ED Course  Procedures (including critical care time) Labs Review Labs Reviewed - No data to display  Imaging Review Ct Head Wo Contrast  09/01/2015   CLINICAL DATA:  Fall, history of prostate cancer  EXAM: CT HEAD WITHOUT CONTRAST  CT CERVICAL SPINE WITHOUT CONTRAST  TECHNIQUE: Multidetector CT imaging of the head and cervical spine was performed following the standard protocol without intravenous contrast. Multiplanar CT image reconstructions of the cervical spine were also generated.  COMPARISON:  08/17/2015  FINDINGS: CT HEAD FINDINGS  No skull fracture is noted. Paranasal sinuses and mastoid air cells are unremarkable.  No intracranial hemorrhage, mass effect or midline shift. Stable atrophy and chronic white matter disease. No acute cortical infarction. No mass lesion is noted on this unenhanced scan.  CT CERVICAL SPINE FINDINGS  Axial images of the cervical spine shows no acute fracture or subluxation. Computer processed images shows no acute fracture or subluxation. There is diffuse osteopenia. Degenerative changes are noted C1-C2 articulation. There is mild disc space flattening with mild posterior spurring at C3-C4 level. Significant disc space flattening with mild anterior mild posterior spurring at C4-C5, C5-C6 and C6-C7 level. No prevertebral soft tissue swelling. Cervical airway is patent.  Multilevel facet degenerative changes are noted. Atherosclerotic calcifications of carotid bifurcation. There is no pneumothorax in visualized lung apices.  IMPRESSION: 1. No acute intracranial abnormality. Stable atrophy and chronic white matter disease. 2. No cervical spine acute fracture or subluxation. Multilevel degenerative changes as described above. 3. Atherosclerotic calcifications of carotid siphon and carotid bifurcation bilaterally.   Electronically Signed   By: Lahoma Crocker M.D.    On: 09/01/2015 12:19   Ct Cervical Spine Wo Contrast  09/01/2015   CLINICAL DATA:  Fall, history of prostate cancer  EXAM: CT HEAD WITHOUT CONTRAST  CT CERVICAL SPINE WITHOUT CONTRAST  TECHNIQUE: Multidetector CT imaging of the head and cervical spine was performed following the standard protocol without intravenous contrast. Multiplanar CT image reconstructions of the cervical spine were also generated.  COMPARISON:  08/17/2015  FINDINGS: CT HEAD FINDINGS  No skull fracture is noted. Paranasal sinuses and mastoid air cells are unremarkable.  No intracranial hemorrhage, mass effect or midline shift. Stable atrophy and chronic white matter disease. No acute cortical infarction. No mass lesion is noted on this unenhanced scan.  CT CERVICAL SPINE FINDINGS  Axial images of the cervical spine shows no acute fracture or subluxation. Computer processed images shows no acute fracture or subluxation. There is diffuse osteopenia. Degenerative changes are noted C1-C2 articulation. There is mild disc space flattening with mild posterior spurring at C3-C4 level. Significant disc space flattening with mild anterior mild posterior spurring at C4-C5, C5-C6  and C6-C7 level. No prevertebral soft tissue swelling. Cervical airway is patent.  Multilevel facet degenerative changes are noted. Atherosclerotic calcifications of carotid bifurcation. There is no pneumothorax in visualized lung apices.  IMPRESSION: 1. No acute intracranial abnormality. Stable atrophy and chronic white matter disease. 2. No cervical spine acute fracture or subluxation. Multilevel degenerative changes as described above. 3. Atherosclerotic calcifications of carotid siphon and carotid bifurcation bilaterally.   Electronically Signed   By: Lahoma Crocker M.D.   On: 09/01/2015 12:19   I have personally reviewed and evaluated these images and lab results as part of my medical decision-making.   EKG Interpretation None      MDM   Final diagnoses:  Fall,  initial encounter  Abrasion  Head injury, initial encounter    No acute brain or bony abnormality noted. Pt is at his baseline.bradycardia noted. Looks like this is similar to previous visit and is his baseline    Glendell Docker, NP 09/01/15 [place], MD 09/02/15 [phone]

## 2015-09-01 NOTE — ED Notes (Signed)
Pt placed on bed alarm due to confusion and climbing out of bed

## 2015-09-01 NOTE — Discharge Instructions (Signed)
Abrasion °An abrasion is a cut or scrape of the skin. Abrasions do not extend through all layers of the skin and most heal within 10 days. It is important to care for your abrasion properly to prevent infection. °CAUSES  °Most abrasions are caused by falling on, or gliding across, the ground or other surface. When your skin rubs on something, the outer and inner layer of skin rubs off, causing an abrasion. °DIAGNOSIS  °Your caregiver will be able to diagnose an abrasion during a physical exam.  °TREATMENT  °Your treatment depends on how large and deep the abrasion is. Generally, your abrasion will be cleaned with water and a mild soap to remove any dirt or debris. An antibiotic ointment may be put over the abrasion to prevent an infection. A bandage (dressing) may be wrapped around the abrasion to keep it from getting dirty.  °You may need a tetanus shot if: °· You cannot remember when you had your last tetanus shot. °· You have never had a tetanus shot. °· The injury broke your skin. °If you get a tetanus shot, your arm may swell, get red, and feel warm to the touch. This is common and not a problem. If you need a tetanus shot and you choose not to have one, there is a rare chance of getting tetanus. Sickness from tetanus can be serious.  °HOME CARE INSTRUCTIONS  °· If a dressing was applied, change it at least once a day or as directed by your caregiver. If the bandage sticks, soak it off with warm water.   °· Wash the area with water and a mild soap to remove all the ointment 2 times a day. Rinse off the soap and pat the area dry with a clean towel.   °· Reapply any ointment as directed by your caregiver. This will help prevent infection and keep the bandage from sticking. Use gauze over the wound and under the dressing to help keep the bandage from sticking.   °· Change your dressing right away if it becomes wet or dirty.   °· Only take over-the-counter or prescription medicines for pain, discomfort, or fever as  directed by your caregiver.   °· Follow up with your caregiver within 24-48 hours for a wound check, or as directed. If you were not given a wound-check appointment, look closely at your abrasion for redness, swelling, or pus. These are signs of infection. °SEEK IMMEDIATE MEDICAL CARE IF:  °· You have increasing pain in the wound.   °· You have redness, swelling, or tenderness around the wound.   °· You have pus coming from the wound.   °· You have a fever or persistent symptoms for more than 2-3 days. °· You have a fever and your symptoms suddenly get worse. °· You have a bad smell coming from the wound or dressing.   °MAKE SURE YOU:  °· Understand these instructions. °· Will watch your condition. °· Will get help right away if you are not doing well or get worse. °Document Released: 09/21/2005 Document Revised: 11/28/2012 Document Reviewed: 11/15/2011 °Exit[employer]® Patient Information ©2015 [employer]. This information is not intended to replace advice given to you by your health care provider. Make sure you discuss any questions you have with your health care provider. ° °Fall Prevention and Home Safety °Falls cause injuries and can affect all age groups. It is possible to use preventive measures to significantly decrease the likelihood of falls. There are many simple measures which can make your home safer and prevent falls. °OUTDOORS °· Repair   cracks and edges of walkways and driveways. °· Remove high doorway thresholds. °· Trim shrubbery on the main path into your home. °· Have good outside lighting. °· Clear walkways of tools, rocks, debris, and clutter. °· Check that handrails are not broken and are securely fastened. Both sides of steps should have handrails. °· Have leaves, snow, and ice cleared regularly. °· Use sand or salt on walkways during winter months. °· In the garage, clean up grease or oil spills. °BATHROOM °· Install night lights. °· Install grab bars by the toilet and in the tub and  shower. °· Use non-skid mats or decals in the tub or shower. °· Place a plastic non-slip stool in the shower to sit on, if needed. °· Keep floors dry and clean up all water on the floor immediately. °· Remove soap buildup in the tub or shower on a regular basis. °· Secure bath mats with non-slip, double-sided rug tape. °· Remove throw rugs and tripping hazards from the floors. °BEDROOMS °· Install night lights. °· Make sure a bedside light is easy to reach. °· Do not use oversized bedding. °· Keep a telephone by your bedside. °· Have a firm chair with side arms to use for getting dressed. °· Remove throw rugs and tripping hazards from the floor. °KITCHEN °· Keep handles on pots and pans turned toward the center of the stove. Use back burners when possible. °· Clean up spills quickly and allow time for drying. °· Avoid walking on wet floors. °· Avoid hot utensils and knives. °· Position shelves so they are not too high or low. °· Place commonly used objects within easy reach. °· If necessary, use a sturdy step stool with a grab bar when reaching. °· Keep electrical cables out of the way. °· Do not use floor polish or wax that makes floors slippery. If you must use wax, use non-skid floor wax. °· Remove throw rugs and tripping hazards from the floor. °STAIRWAYS °· Never leave objects on stairs. °· Place handrails on both sides of stairways and use them. Fix any loose handrails. Make sure handrails on both sides of the stairways are as long as the stairs. °· Check carpeting to make sure it is firmly attached along stairs. Make repairs to worn or loose carpet promptly. °· Avoid placing throw rugs at the top or bottom of stairways, or properly secure the rug with carpet tape to prevent slippage. Get rid of throw rugs, if possible. °· Have an electrician put in a light switch at the top and bottom of the stairs. °OTHER FALL PREVENTION TIPS °· Wear low-heel or rubber-soled shoes that are supportive and fit well. Wear  closed toe shoes. °· When using a stepladder, make sure it is fully opened and both spreaders are firmly locked. Do not climb a closed stepladder. °· Add color or contrast paint or tape to grab bars and handrails in your home. Place contrasting color strips on first and last steps. °· Learn and use mobility aids as needed. Install an electrical emergency response system. °· Turn on lights to avoid dark areas. Replace light bulbs that burn out immediately. Get light switches that glow. °· Arrange furniture to create clear pathways. Keep furniture in the same place. °· Firmly attach carpet with non-skid or double-sided tape. °· Eliminate uneven floor surfaces. °· Select a carpet pattern that does not visually hide the edge of steps. °· Be aware of all pets. °OTHER HOME SAFETY TIPS °· Set the water temperature for   120° F (48.8° C). °· Keep emergency numbers on or near the telephone. °· Keep smoke detectors on every level of the home and near sleeping areas. °Document Released: 12/02/2002 Document Revised: 06/12/2012 Document Reviewed: 03/02/2012 °ExitCare® Patient Information ©2015 [employer]. This information is not intended to replace advice given to you by your health care provider. Make sure you discuss any questions you have with your health care provider. ° °

## 2015-09-01 NOTE — ED Notes (Signed)
Patient had a unwitnessed fall today at nursing facility. Patient is ambulatory and it is not clear how he fell. Patient has abrasions to forehead. Patient has old abrasion to left arm and he complains that it hurts. Patient was ambulatory on scene.

## 2016-05-13 IMAGING — CT CT CERVICAL SPINE W/O CM
3 of 4 series · 13 of 33 positions shown, 16 images · non-contrast
Comparison: 08/17/2015

CLINICAL DATA: Fall, history of prostate cancer

EXAM:
CT HEAD WITHOUT CONTRAST
CT CERVICAL SPINE WITHOUT CONTRAST
TECHNIQUE: Multidetector CT imaging of the head and cervical spine was
performed following the standard protocol without intravenous
contrast. Multiplanar CT image reconstructions of the cervical spine
were also generated.

[Series 6: axial recon · axial · 0.23mm/px · z∈[-317,-121]mm · 5 of 148 slices shown, 7 images]
[im 22/148  soft-tissue]
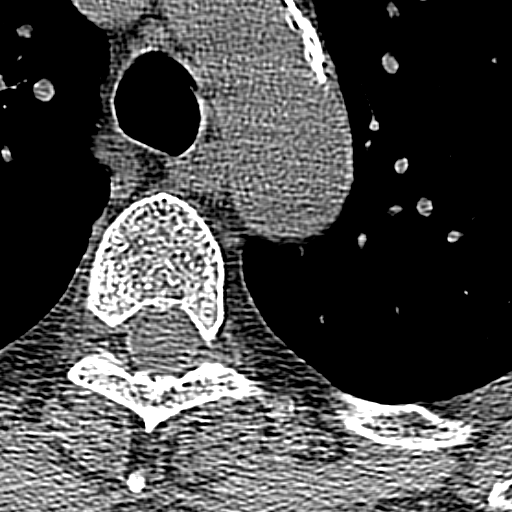
[im 22/148  bone]
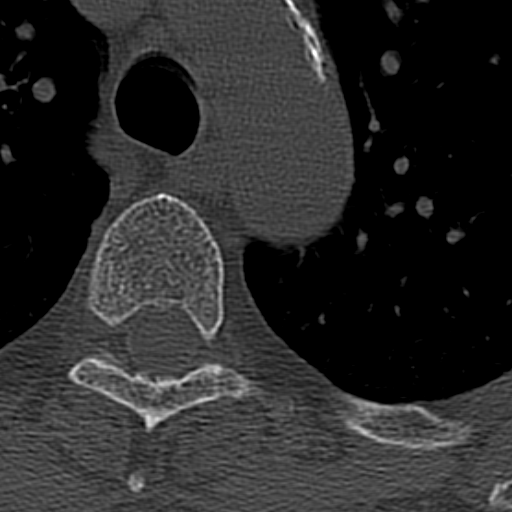
[im 43/148  bone]
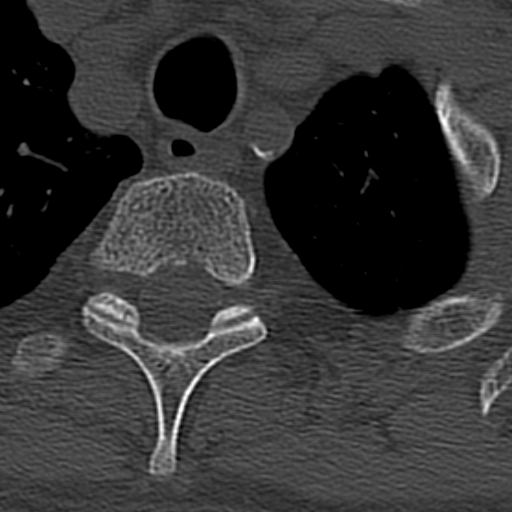
[im 85/148  bone]
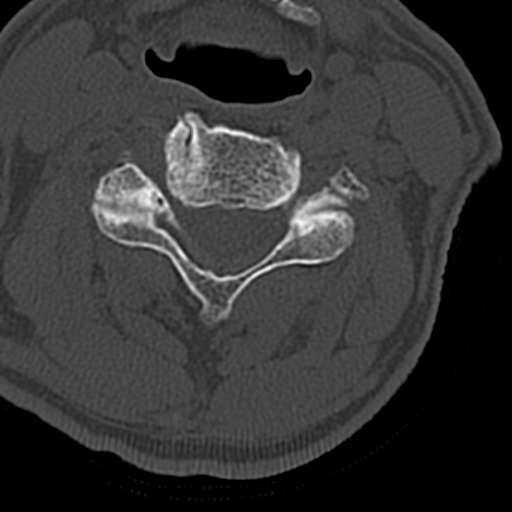
[im 106/148  bone]
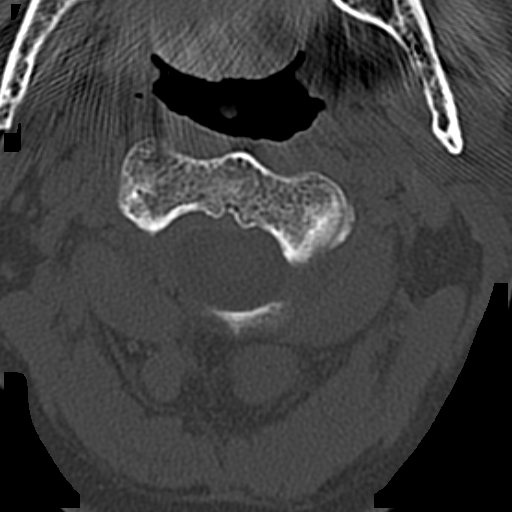
[im 127/148  soft-tissue]
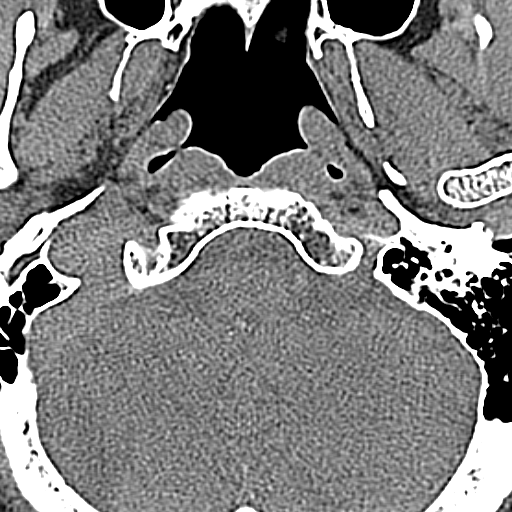
[im 127/148  bone]
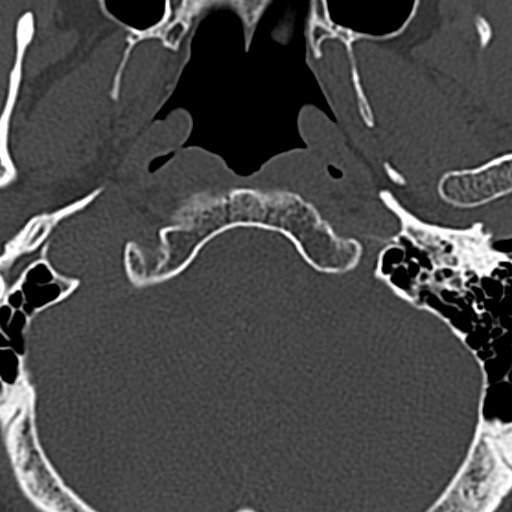

[Series 7: coronal · coronal · 0.36mm/px · 3 of 93 slices shown]
[im 19/93  bone]
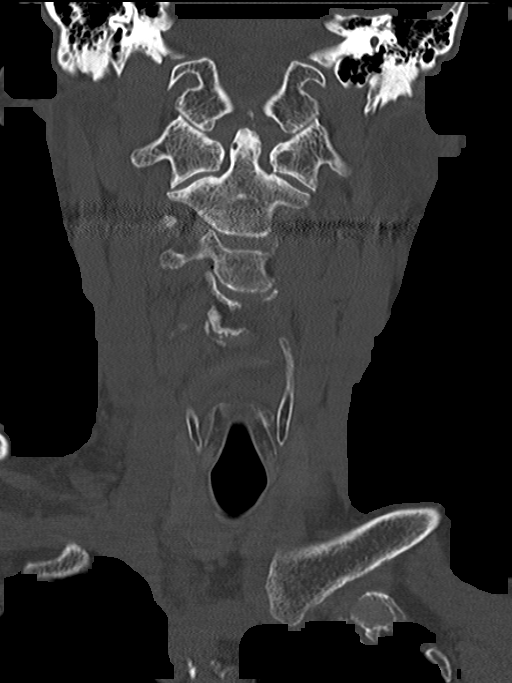
[im 37/93  bone]
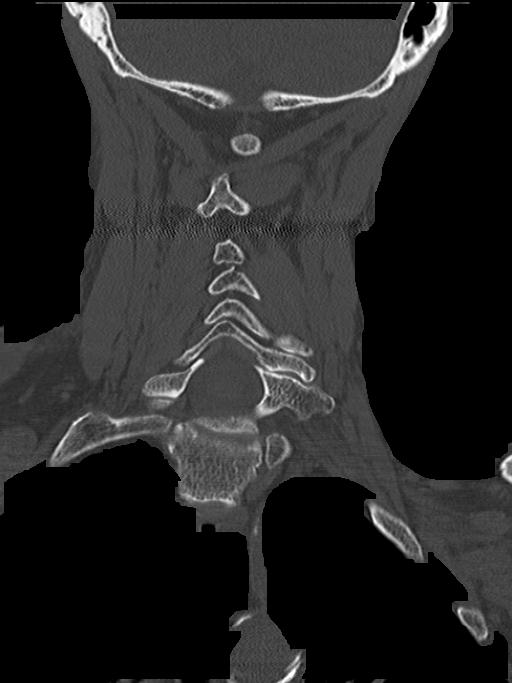
[im 56/93  bone]
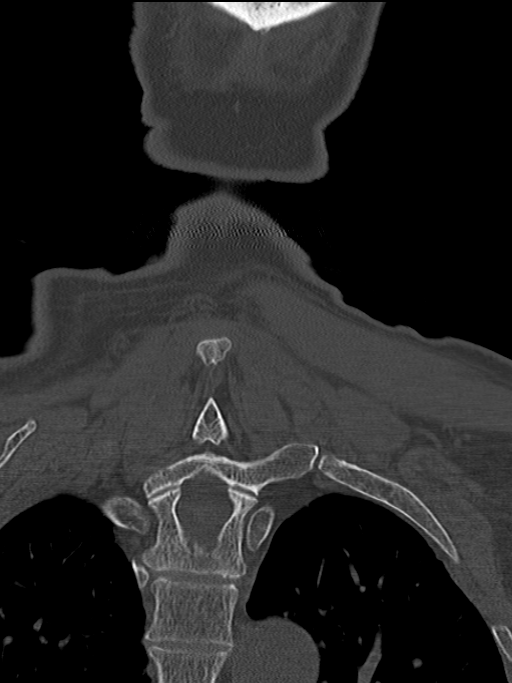

[Series 8: sagittal · sagittal · 0.36mm/px · 5 of 61 slices shown, 6 images]
[im 21/61  bone]
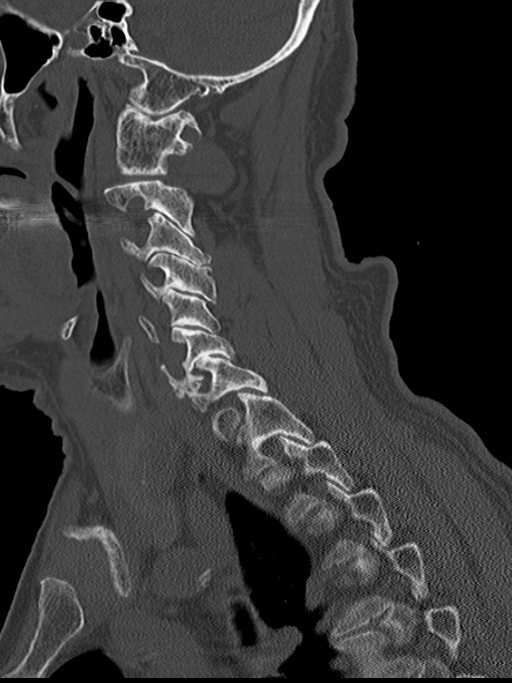
[im 26/61  bone]
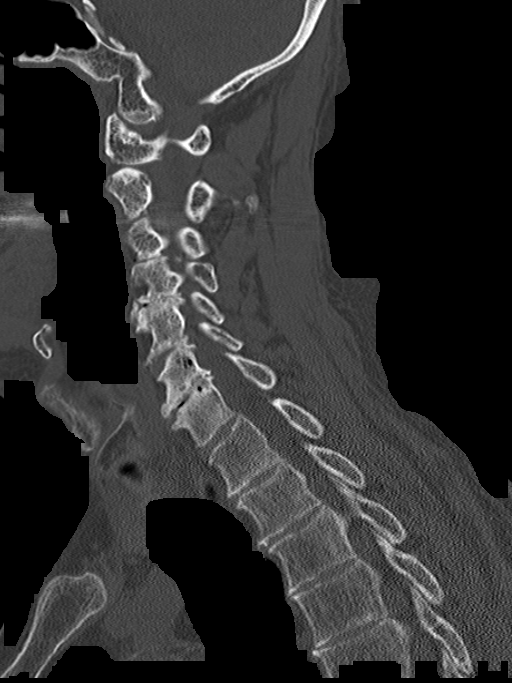
[im 31/61  soft-tissue]
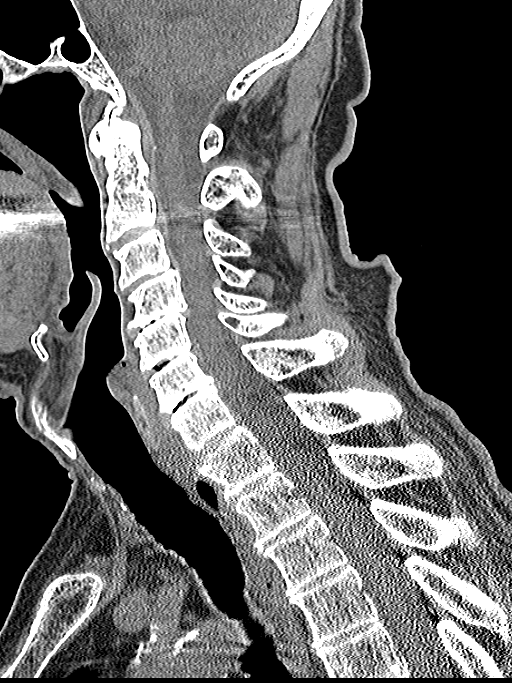
[im 31/61  bone]
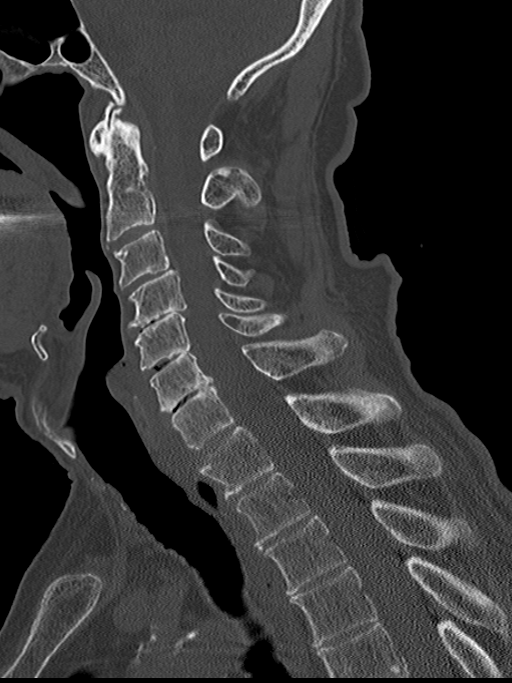
[im 36/61  bone]
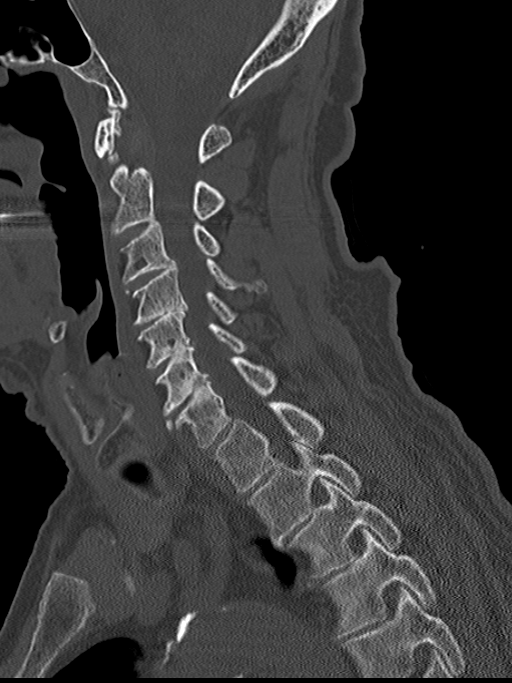
[im 41/61  bone]
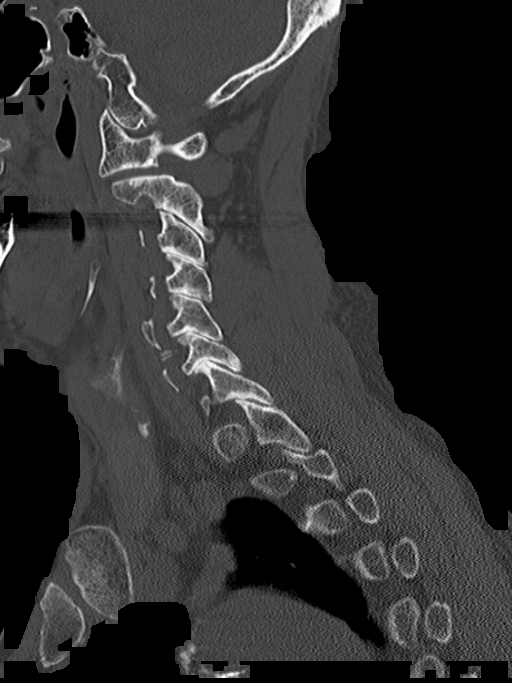

[13 of 33 positions shown; findings below may reference images not displayed]

FINDINGS: CT HEAD FINDINGS

No skull fracture is noted. Paranasal sinuses and mastoid air cells
are unremarkable.

No intracranial hemorrhage, mass effect or midline shift. Stable
atrophy and chronic white matter disease. No acute cortical
infarction. No mass lesion is noted on this unenhanced scan.

CT CERVICAL SPINE FINDINGS

Axial images of the cervical spine shows no acute fracture or
subluxation. Computer processed images shows no acute fracture or
subluxation. There is diffuse osteopenia. Degenerative changes are
noted C1-C2 articulation. There is mild disc space flattening with
mild posterior spurring at C3-C4 level. Significant disc space
flattening with mild anterior mild posterior spurring at C4-C5,
C5-C6 and C6-C7 level. No prevertebral soft tissue swelling.
Cervical airway is patent.

Multilevel facet degenerative changes are noted. Atherosclerotic
calcifications of carotid bifurcation. There is no pneumothorax in
visualized lung apices.
IMPRESSION: 1. No acute intracranial abnormality. Stable atrophy and chronic
white matter disease.
2. No cervical spine acute fracture or subluxation. Multilevel
degenerative changes as described above.
3. Atherosclerotic calcifications of carotid siphon and carotid
bifurcation bilaterally.

## 2017-06-05 ENCOUNTER — Encounter (HOSPITAL_COMMUNITY): Payer: Self-pay | Admitting: *Deleted

## 2017-06-05 ENCOUNTER — Emergency Department (HOSPITAL_COMMUNITY)
Admission: EM | Admit: 2017-06-05 | Discharge: 2017-06-05 | Disposition: A | Discharge status: Home / Self Care | Payer: Medicare PPO | Attending: Emergency Medicine | Admitting: Emergency Medicine

## 2017-06-05 DIAGNOSIS — Z7982 Long term (current) use of aspirin: Secondary | ICD-10-CM | POA: Diagnosis not present

## 2017-06-05 DIAGNOSIS — Y939 Activity, unspecified: Secondary | ICD-10-CM | POA: Diagnosis not present

## 2017-06-05 DIAGNOSIS — Y92129 Unspecified place in nursing home as the place of occurrence of the external cause: Secondary | ICD-10-CM | POA: Diagnosis not present

## 2017-06-05 DIAGNOSIS — Z8659 Personal history of other mental and behavioral disorders: Secondary | ICD-10-CM

## 2017-06-05 DIAGNOSIS — S0001XA Abrasion of scalp, initial encounter: Secondary | ICD-10-CM | POA: Insufficient documentation

## 2017-06-05 DIAGNOSIS — I10 Essential (primary) hypertension: Secondary | ICD-10-CM | POA: Insufficient documentation

## 2017-06-05 DIAGNOSIS — Z79899 Other long term (current) drug therapy: Secondary | ICD-10-CM | POA: Insufficient documentation

## 2017-06-05 DIAGNOSIS — Z23 Encounter for immunization: Secondary | ICD-10-CM | POA: Diagnosis not present

## 2017-06-05 DIAGNOSIS — Z87891 Personal history of nicotine dependence: Secondary | ICD-10-CM | POA: Diagnosis not present

## 2017-06-05 DIAGNOSIS — W1812XA Fall from or off toilet with subsequent striking against object, initial encounter: Secondary | ICD-10-CM | POA: Insufficient documentation

## 2017-06-05 DIAGNOSIS — G309 Alzheimer's disease, unspecified: Secondary | ICD-10-CM | POA: Insufficient documentation

## 2017-06-05 DIAGNOSIS — Z8546 Personal history of malignant neoplasm of prostate: Secondary | ICD-10-CM | POA: Diagnosis not present

## 2017-06-05 DIAGNOSIS — W010XXA Fall on same level from slipping, tripping and stumbling without subsequent striking against object, initial encounter: Secondary | ICD-10-CM

## 2017-06-05 DIAGNOSIS — Y999 Unspecified external cause status: Secondary | ICD-10-CM | POA: Insufficient documentation

## 2017-06-05 MED ORDER — BACITRACIN ZINC 500 UNIT/GM EX OINT
TOPICAL_OINTMENT | Freq: Two times a day (BID) | CUTANEOUS | Status: DC
  Filled 2017-06-05: qty 0.9

## 2017-06-05 MED ORDER — TETANUS-DIPHTH-ACELL PERTUSSIS 5-2.5-18.5 LF-MCG/0.5 IM SUSP
0.5000 mL | Freq: Once | INTRAMUSCULAR | Status: AC
  Administered 2017-06-05: 0.5 mL via INTRAMUSCULAR
  Filled 2017-06-05: qty 0.5

## 2017-06-05 NOTE — ED Notes (Signed)
Bed: [facility] Expected date:  Expected time:  Means of arrival:  Comments: EMS-fall 

## 2017-06-05 NOTE — Discharge Instructions (Signed)
It was our pleasure to provide your ER care today - we hope that you feel better.  Fall precautions.  Follow up with primary care doctor in the next 1-2 weeks.  Return to ER if worse, new symptoms, persistent vomiting, change in mental status, new or severe pain, other concern.

## 2017-06-05 NOTE — ED Provider Notes (Signed)
[facility] DEPT Provider Note   CSN: [id number] Arrival date & time: 06/05/17  0930     History   Chief Complaint Chief Complaint  Patient presents with  . Fall    HPI George Garrett is a 76 y.o. male.  Patient with hx dementia, s/p fall at ecf.  No loc with fall. Patient has been alert, with mental status c/w baseline since fall per spouse. No vomiting. No recent/acute illness. No fevers. Patient not verbally responsive to questions - level 5 caveat. Last tetanus unknown. Abrasions to scalp.    The history is provided by the patient and the spouse. The history is limited by the condition of the patient.  Fall     Past Medical History:  Diagnosis Date  . Alzheimer's dementia   . Benign familial tremor   . Cervical spondylosis   . Chronic back pain   . ED (erectile dysfunction)   . Fuchs' corneal dystrophy   . GERD (gastroesophageal reflux disease)   . Gilbert's syndrome   . History of colon polyps   . Hypercholesteremia   . Hypertension   . Mild mitral regurgitation   . Normocytic normochromic anemia   . Prostate cancer ([place])   . Rosacea     There are no active problems to display for this patient.   History reviewed. No pertinent surgical history.     Home Medications    Prior to Admission medications   Medication Sig Start Date End Date Taking? Authorizing Provider  acetaminophen (TYLENOL) 500 MG tablet Take 1,000 mg by mouth daily as needed for mild pain or moderate pain.    [provider]  amLODipine (NORVASC) 5 MG tablet Take 5 mg by mouth 2 (two) times daily.     [provider]  aspirin 81 MG tablet Take 81 mg by mouth daily.    [provider]  divalproex (DEPAKOTE SPRINKLE) 125 MG capsule Take 250-375 mg by mouth 2 (two) times daily. Take 250 mg by mouth every morning  & 375 mg by mouth every evening    [provider]  escitalopram (LEXAPRO) 20 MG tablet Take 20 mg by mouth daily.    [provider]  lisinopril (PRINIVIL,ZESTRIL) 10 MG tablet Take 10 mg by mouth 2 (two) times daily.    [provider]  LORazepam (ATIVAN) 0.5 MG tablet Take 0.5 mg by mouth 2 (two) times daily.     [provider]  Memantine HCl ER (NAMENDA XR) 28 MG CP24 Take 28 mg by mouth daily.     [provider]  metoprolol tartrate (LOPRESSOR) 25 MG tablet Take 25 mg by mouth 2 (two) times daily.    [provider]  polyethylene glycol (MIRALAX / GLYCOLAX) packet Take 17 g by mouth daily as needed for mild constipation or moderate constipation.     [provider]  QUEtiapine (SEROQUEL) 25 MG tablet Take 25 mg by mouth at bedtime.    [provider]  silodosin (RAPAFLO) 8 MG CAPS capsule Take 8 mg by mouth daily.     [provider]    Family History Family History  Problem Relation Age of Onset  . Heart attack Mother   . Alzheimer's disease Mother   . Hypertension Sister   . Hypertension Brother   . Alzheimer's disease Maternal Aunt   . Alzheimer's disease Maternal Grandmother     Social History Social History  Substance Use Topics  . Smoking status: Former Smoker  Quit date: 12/26/1993  . Smokeless tobacco: Never Used  . Alcohol use 4.2 oz/week    7 Glasses of wine per week     Allergies   Aricept [donepezil hcl]; Crestor [rosuvastatin]; Flomax [tamsulosin hcl]; Lipitor [atorvastatin]; Maxzide [hydrochlorothiazide w-triamterene]; Pravachol [pravastatin sodium]; Sertraline; Simvastatin; Welchol [colesevelam hcl]; and Zetia [ezetimibe]   Review of Systems Review of Systems  Unable to perform ROS: Dementia  level 5 caveat - dementia   Physical Exam Updated Vital Signs BP (!) 146/67   Pulse (!) 51   Temp 98.3 F (36.8 C) (Oral)   Resp 15   SpO2 99%   Physical Exam  Constitutional: He appears well-developed and well-nourished. No distress.  HENT:  Superficial abrasions to scalp. Nares patent. No septal hematoma.   Eyes:  Conjunctivae are normal. Pupils are equal, round, and reactive to light.  Neck: Normal range of motion. Neck supple. No tracheal deviation present.  Cardiovascular: Normal rate, regular rhythm, normal heart sounds and intact distal pulses.   Pulmonary/Chest: Effort normal and breath sounds normal. No accessory muscle usage. No respiratory distress.  Abdominal: Soft. He exhibits no distension. There is no tenderness.  Musculoskeletal: He exhibits no edema.  CTLS spine, non tender, aligned, no step off.  Good rom bil ext without pain or focal bony tenderness.    Neurological: He is alert.  Alert, content appearing. Moves bil extremities purposefully w good strength.   Skin: Skin is warm and dry. No rash noted. He is not diaphoretic.  Psychiatric:  Family states mental status at baseline   Nursing note and vitals reviewed.    ED Treatments / Results  Labs (all labs ordered are listed, but only abnormal results are displayed) Labs Reviewed - No data to display  EKG  EKG Interpretation None       Radiology No results found.  Procedures Procedures (including critical care time)  Medications Ordered in ED Medications  Tdap (BOOSTRIX) injection 0.5 mL (not administered)  bacitracin ointment (not administered)     Initial Impression / Assessment and Plan / ED Course  I have reviewed the triage vital signs and the nursing notes.  Pertinent labs & imaging results that were available during my care of the patient were reviewed by me and considered in my medical decision making (see chart for details).  Abrasions cleaned, bacitracin to wounds.  Tetanus IM.  Spine nt.  No loc w fall and mental status c/w baseline.   Pt appears stable for d/c.     Final Clinical Impressions(s) / ED Diagnoses   Final diagnoses:  None    New Prescriptions New Prescriptions   No medications on file     Lajean Saver, MD 06/05/17 1043

## 2017-06-05 NOTE — ED Notes (Signed)
[facility] notified for Pt transportation 

## 2017-06-05 NOTE — ED Triage Notes (Signed)
Per EMS pt from [facility] with a c/o fall, per EMS pt fell off of the toilet hitting his head on the ground. No LOC, no blood thinners. Pt has some abrasions to his face, alert as per his baseline.

## 2017-12-02 ENCOUNTER — Emergency Department (HOSPITAL_COMMUNITY): Payer: Medicare PPO

## 2017-12-02 ENCOUNTER — Other Ambulatory Visit: Payer: Self-pay

## 2017-12-02 ENCOUNTER — Emergency Department (HOSPITAL_COMMUNITY)
Admission: EM | Admit: 2017-12-02 | Discharge: 2017-12-02 | Disposition: A | Discharge status: Home / Self Care | Payer: Medicare PPO | Attending: Emergency Medicine | Admitting: Emergency Medicine

## 2017-12-02 ENCOUNTER — Encounter (HOSPITAL_COMMUNITY): Payer: Self-pay

## 2017-12-02 DIAGNOSIS — I10 Essential (primary) hypertension: Secondary | ICD-10-CM | POA: Diagnosis not present

## 2017-12-02 DIAGNOSIS — Y92129 Unspecified place in nursing home as the place of occurrence of the external cause: Secondary | ICD-10-CM | POA: Insufficient documentation

## 2017-12-02 DIAGNOSIS — S0101XA Laceration without foreign body of scalp, initial encounter: Secondary | ICD-10-CM | POA: Insufficient documentation

## 2017-12-02 DIAGNOSIS — Z8546 Personal history of malignant neoplasm of prostate: Secondary | ICD-10-CM | POA: Diagnosis not present

## 2017-12-02 DIAGNOSIS — S0003XA Contusion of scalp, initial encounter: Secondary | ICD-10-CM

## 2017-12-02 DIAGNOSIS — F0280 Dementia in other diseases classified elsewhere without behavioral disturbance: Secondary | ICD-10-CM | POA: Insufficient documentation

## 2017-12-02 DIAGNOSIS — Z87891 Personal history of nicotine dependence: Secondary | ICD-10-CM | POA: Insufficient documentation

## 2017-12-02 DIAGNOSIS — G309 Alzheimer's disease, unspecified: Secondary | ICD-10-CM | POA: Diagnosis not present

## 2017-12-02 DIAGNOSIS — Z79899 Other long term (current) drug therapy: Secondary | ICD-10-CM | POA: Diagnosis not present

## 2017-12-02 DIAGNOSIS — Y939 Activity, unspecified: Secondary | ICD-10-CM | POA: Insufficient documentation

## 2017-12-02 DIAGNOSIS — W19XXXA Unspecified fall, initial encounter: Secondary | ICD-10-CM | POA: Diagnosis not present

## 2017-12-02 DIAGNOSIS — S0990XA Unspecified injury of head, initial encounter: Secondary | ICD-10-CM | POA: Diagnosis present

## 2017-12-02 DIAGNOSIS — Y999 Unspecified external cause status: Secondary | ICD-10-CM | POA: Diagnosis not present

## 2017-12-02 MED ORDER — BACITRACIN ZINC 500 UNIT/GM EX OINT
1.0000 | TOPICAL_OINTMENT | Freq: Two times a day (BID) | CUTANEOUS | 0 refills | Status: AC
Start: 2017-12-02 — End: ?

## 2017-12-02 MED ORDER — LIDOCAINE-EPINEPHRINE (PF) 2 %-1:200000 IJ SOLN
INTRAMUSCULAR | Status: AC
  Administered 2017-12-02: 20 mL via INTRADERMAL
  Filled 2017-12-02: qty 20

## 2017-12-02 MED ORDER — LIDOCAINE-EPINEPHRINE (PF) 2 %-1:200000 IJ SOLN
20.0000 mL | Freq: Once | INTRAMUSCULAR | Status: AC
  Administered 2017-12-02: 20 mL via INTRADERMAL

## 2017-12-02 NOTE — ED Triage Notes (Signed)
EMS reports from [place] [facility], unwitnessed fall, small LAC occipital head. Normal baseline A&O x 1 non verbal, Not on blood thinners. Hx of Alzheimers.  BP 146/96 HR 58  Resp 14 sp02 98 RA CBG 127

## 2017-12-02 NOTE — ED Notes (Signed)
Bed: BU03 Expected date:  Expected time:  Means of arrival:  Comments: Fall

## 2017-12-02 NOTE — ED Notes (Signed)
ED TO INPATIENT HANDOFF REPORT  Name/Age/Gender George Garrett 76 y.o. male  Code Status Code Status History    This patient does not have a recorded code status. Please follow your organizational policy for patients in this situation.    Advance Directive Documentation     Most Recent Value  Type of Advance Directive  Out of facility DNR (pink MOST or yellow form)  Pre-existing out of facility DNR order (yellow form or pink MOST form)  Yellow form placed in chart (order not valid for inpatient use)  "MOST" Form in Place?  No data      Home/SNF/Other Nursing Home  Chief Complaint fall, hit head  Level of Care/Admitting Diagnosis ED Disposition    ED Disposition Condition Comment   Discharge  The patient appears reasonably screened and/or stabilized for discharge and I doubt any other medical condition or other [facility] requiring further screening, evaluation, or treatment in the ED exists or is present at this time prior to discharge.       Medical History Past Medical History:  Diagnosis Date  . Alzheimer's dementia   . Benign familial tremor   . Cervical spondylosis   . Chronic back pain   . ED (erectile dysfunction)   . Fuchs' corneal dystrophy   . GERD (gastroesophageal reflux disease)   . Gilbert's syndrome   . History of colon polyps   . Hypercholesteremia   . Hypertension   . Mild mitral regurgitation   . Normocytic normochromic anemia   . Prostate cancer ([place])   . Rosacea     Allergies Allergies  Allergen Reactions  . Aricept [Donepezil Hcl]     Bradycardia  . Crestor [Rosuvastatin]     Muscle Pain  . Flomax [Tamsulosin Hcl]     Weakness, dizziness  . Lipitor [Atorvastatin]     Fatigue  . Maxzide [Hydrochlorothiazide W-Triamterene]   . Pravachol [Pravastatin Sodium]   . Sertraline     Diarrhea   . Simvastatin     Fatigue  . Welchol [Colesevelam Hcl]     Gas  . Zetia [Ezetimibe]     Diarrhea     IV Location/Drains/Wounds Patient  Lines/Drains/Airways Status   Active Line/Drains/Airways    None          Labs/Imaging No results found for this or any previous visit (from the past 48 hour(s)). Ct Head Wo Contrast  Result Date: 12/02/2017 CLINICAL DATA:  76 year old male status post unwitnessed fall EXAM: CT HEAD WITHOUT CONTRAST CT CERVICAL SPINE WITHOUT CONTRAST TECHNIQUE: Multidetector CT imaging of the head and cervical spine was performed following the standard protocol without intravenous contrast. Multiplanar CT image reconstructions of the cervical spine were also generated. COMPARISON:  Prior CT scan of the head and cervical spine 09/01/2015 FINDINGS: CT HEAD FINDINGS Brain: No evidence of acute infarction, hemorrhage, hydrocephalus, extra-axial collection or mass lesion/mass effect. Stable atrophy, ex vacuo ventriculomegaly and moderately advanced periventricular and subcortical white matter hypoattenuation most consistent with chronic ischemic white matter disease. Vascular: Mild calcification in the bilateral cavernous carotid arteries. No hyperdense vessel sign. Skull: Normal. Negative for fracture or focal lesion. Sinuses/Orbits: No acute finding. Other: Small soft tissue defect at the occipital scalp consistent with laceration. Minimal underlying scalp contusion. CT CERVICAL SPINE FINDINGS Alignment: Chronic loss of the normal cervical lordosis likely related to underlying degenerative disc disease. Skull base and vertebrae: No acute fracture. No primary bone lesion or focal pathologic process. Soft tissues and spinal canal: No prevertebral fluid or  swelling. No visible canal hematoma. Disc levels: Multilevel degenerative disc disease throughout the entirety of the cervical spine but most significant in the mid to distal cervical spine. Stable mild (2-3 mm) anterolisthesis of C3 on C4. Stable mild retrolisthesis of C4 on C5. Multilevel bilateral facet joint arthropathy without interval progression. Upper chest:  Negative. Other: None IMPRESSION: CT HEAD 1. No acute intracranial abnormality. 2. Small occipital scalp contusion with overlying laceration. No evidence of skull fracture. 3. Stable atrophy, ventriculomegaly and moderately advanced chronic microvascular ischemic white matter disease. CT CSPINE 1. No acute fracture or malalignment. 2. Stable multilevel degenerative disc disease and bilateral facet joint arthropathy. Electronically Signed   By: Jacqulynn Cadet M.D.   On: 12/02/2017 10:52   Ct Cervical Spine Wo Contrast  Result Date: 12/02/2017 CLINICAL DATA:  76 year old male status post unwitnessed fall EXAM: CT HEAD WITHOUT CONTRAST CT CERVICAL SPINE WITHOUT CONTRAST TECHNIQUE: Multidetector CT imaging of the head and cervical spine was performed following the standard protocol without intravenous contrast. Multiplanar CT image reconstructions of the cervical spine were also generated. COMPARISON:  Prior CT scan of the head and cervical spine 09/01/2015 FINDINGS: CT HEAD FINDINGS Brain: No evidence of acute infarction, hemorrhage, hydrocephalus, extra-axial collection or mass lesion/mass effect. Stable atrophy, ex vacuo ventriculomegaly and moderately advanced periventricular and subcortical white matter hypoattenuation most consistent with chronic ischemic white matter disease. Vascular: Mild calcification in the bilateral cavernous carotid arteries. No hyperdense vessel sign. Skull: Normal. Negative for fracture or focal lesion. Sinuses/Orbits: No acute finding. Other: Small soft tissue defect at the occipital scalp consistent with laceration. Minimal underlying scalp contusion. CT CERVICAL SPINE FINDINGS Alignment: Chronic loss of the normal cervical lordosis likely related to underlying degenerative disc disease. Skull base and vertebrae: No acute fracture. No primary bone lesion or focal pathologic process. Soft tissues and spinal canal: No prevertebral fluid or swelling. No visible canal hematoma. Disc  levels: Multilevel degenerative disc disease throughout the entirety of the cervical spine but most significant in the mid to distal cervical spine. Stable mild (2-3 mm) anterolisthesis of C3 on C4. Stable mild retrolisthesis of C4 on C5. Multilevel bilateral facet joint arthropathy without interval progression. Upper chest: Negative. Other: None IMPRESSION: CT HEAD 1. No acute intracranial abnormality. 2. Small occipital scalp contusion with overlying laceration. No evidence of skull fracture. 3. Stable atrophy, ventriculomegaly and moderately advanced chronic microvascular ischemic white matter disease. CT CSPINE 1. No acute fracture or malalignment. 2. Stable multilevel degenerative disc disease and bilateral facet joint arthropathy. Electronically Signed   By: Jacqulynn Cadet M.D.   On: 12/02/2017 10:52    Pending Labs Unresulted Labs (From admission, onward)   None      Vitals/Pain Today's Vitals   12/02/17 0946 12/02/17 0947 12/02/17 1205 12/02/17 1230  BP: (!) 145/64  124/64 127/62  Pulse: 65  84 87  Resp: 16  16 18   Temp: 98.2 F (36.8 C)     TempSrc: Oral     SpO2: 98%  100% 95%  Weight:  165 lb (74.8 kg)    Height:  5\' 10"  (1.778 m)      Isolation Precautions No active isolations  Medications Medications  lidocaine-EPINEPHrine (XYLOCAINE W/EPI) 2 %-1:200000 (PF) injection 20 mL (20 mLs Intradermal Given 12/02/17 1211)    Mobility non-ambulatory

## 2017-12-02 NOTE — ED Notes (Signed)
[facility] contacted for transport 

## 2017-12-02 NOTE — ED Provider Notes (Signed)
[facility] DEPT Provider Note   CSN: [id number] Arrival date & time: 12/02/17  7253     History   Chief Complaint Chief Complaint  Patient presents with  . Fall    HPI George Garrett is a 76 y.o. male.  HPI  76 yo M wth h/o Alzheimer's here with fall. History limited 2/2 dementia. Pt reportedly had unwitnessed fall this AM. He is o/w at his baseline, minimally verbal. Not on blood thinners. Last tetanus this year. Facility found him down, on the ground, with small amount of blood from posterior scalp lac.  Level 5 caveat invoked as remainder of history, ROS, and physical exam limited due to patient's severe dementia.   Past Medical History:  Diagnosis Date  . Alzheimer's dementia   . Benign familial tremor   . Cervical spondylosis   . Chronic back pain   . ED (erectile dysfunction)   . Fuchs' corneal dystrophy   . GERD (gastroesophageal reflux disease)   . Gilbert's syndrome   . History of colon polyps   . Hypercholesteremia   . Hypertension   . Mild mitral regurgitation   . Normocytic normochromic anemia   . Prostate cancer ([place])   . Rosacea     There are no active problems to display for this patient.   History reviewed. No pertinent surgical history.     Home Medications    Prior to Admission medications   Medication Sig Start Date End Date Taking? Authorizing Provider  acetaminophen (TYLENOL) 500 MG tablet Take 1,000 mg by mouth daily as needed for mild pain or moderate pain.   Yes [provider]  amLODipine (NORVASC) 10 MG tablet Take 10 mg by mouth daily.   Yes [provider]  escitalopram (LEXAPRO) 20 MG tablet Take 20 mg by mouth at bedtime.    Yes [provider]  lisinopril-hydrochlorothiazide (PRINZIDE,ZESTORETIC) 20-12.5 MG tablet Take 1 tablet by mouth every evening.   Yes [provider]  loperamide (ANTI-DIARRHEAL) 2 MG capsule Take 2 mg by mouth as needed for diarrhea or  loose stools.   Yes [provider]  LORazepam (ATIVAN) 0.5 MG tablet Take 0.5 mg by mouth at bedtime.    Yes [provider]  Melatonin 10 MG TABS Take 10 mg by mouth at bedtime.   Yes [provider]  memantine (NAMENDA) 10 MG tablet Take 10 mg by mouth 2 (two) times daily.   Yes [provider]  polyethylene glycol (MIRALAX / GLYCOLAX) packet Take 17 g by mouth daily as needed for mild constipation or moderate constipation.    Yes [provider]  QUEtiapine (SEROQUEL) 25 MG tablet Take 50 mg by mouth at bedtime.    Yes [provider]  QUEtiapine (SEROQUEL) 25 MG tablet Take 25 mg by mouth at bedtime as needed (sleep).   Yes [provider]  bacitracin ointment Apply 1 application topically 2 (two) times daily. For 10 days 12/02/17   Duffy Bruce, MD    Family History Family History  Problem Relation Age of Onset  . Heart attack Mother   . Alzheimer's disease Mother   . Hypertension Sister   . Hypertension Brother   . Alzheimer's disease Maternal Aunt   . Alzheimer's disease Maternal Grandmother     Social History Social History   Tobacco Use  . Smoking status: Former Smoker    Last attempt to quit: 12/26/1993    Years since quitting: 23.9  . Smokeless tobacco:  Never Used  Substance Use Topics  . Alcohol use: Yes    Alcohol/week: 4.2 oz    Types: 7 Glasses of wine per week  . Drug use: No     Allergies   Aricept [donepezil hcl]; Crestor [rosuvastatin]; Flomax [tamsulosin hcl]; Lipitor [atorvastatin]; Maxzide [hydrochlorothiazide w-triamterene]; Pravachol [pravastatin sodium]; Sertraline; Simvastatin; Welchol [colesevelam hcl]; and Zetia [ezetimibe]   Review of Systems Review of Systems  Unable to perform ROS: Dementia  Skin: Positive for wound.     Physical Exam Updated Vital Signs BP 127/62   Pulse 87   Temp 98.2 F (36.8 C) (Oral)   Resp 18   Ht 5\' 10"  (1.778 m)   Wt 74.8 kg (165 lb)   SpO2  95%   BMI 23.68 kg/m   Physical Exam  Constitutional: He appears well-developed and well-nourished. No distress.  HENT:  Head: Normocephalic and atraumatic.  Small, 1.5 cm laceration to left parieto-occipital scalp. No appreciable FB. Bleeding controlled. No Battle's sign. No periorbital ecchymoses.  Eyes: Conjunctivae are normal.  Neck: Neck supple.  No midline TTP.  Cardiovascular: Normal rate, regular rhythm and normal heart sounds. Exam reveals no friction rub.  No murmur heard. Pulmonary/Chest: Effort normal and breath sounds normal. No respiratory distress. He has no wheezes. He has no rales.  Chest wall stable, no TTP  Abdominal: Soft. Bowel sounds are normal. He exhibits no distension. There is no tenderness. There is no guarding.  Musculoskeletal: He exhibits no edema.  No TTP or deformity over b/l shoulders, hips, and extremties.  Neurological: He is alert.  R>L resting tremor noted. Moans, responds to painful stimuli. Face symmetric. Not oriented to person, place or time however. Protecting airway. MAE.  Skin: Skin is warm. Capillary refill takes less than 2 seconds. No rash noted.  Psychiatric: He has a normal mood and affect.  Nursing note and vitals reviewed.    ED Treatments / Results  Labs (all labs ordered are listed, but only abnormal results are displayed) Labs Reviewed - No data to display  EKG  EKG Interpretation None       Radiology Ct Head Wo Contrast  Result Date: 12/02/2017 CLINICAL DATA:  76 year old male status post unwitnessed fall EXAM: CT HEAD WITHOUT CONTRAST CT CERVICAL SPINE WITHOUT CONTRAST TECHNIQUE: Multidetector CT imaging of the head and cervical spine was performed following the standard protocol without intravenous contrast. Multiplanar CT image reconstructions of the cervical spine were also generated. COMPARISON:  Prior CT scan of the head and cervical spine 09/01/2015 FINDINGS: CT HEAD FINDINGS Brain: No evidence of acute  infarction, hemorrhage, hydrocephalus, extra-axial collection or mass lesion/mass effect. Stable atrophy, ex vacuo ventriculomegaly and moderately advanced periventricular and subcortical white matter hypoattenuation most consistent with chronic ischemic white matter disease. Vascular: Mild calcification in the bilateral cavernous carotid arteries. No hyperdense vessel sign. Skull: Normal. Negative for fracture or focal lesion. Sinuses/Orbits: No acute finding. Other: Small soft tissue defect at the occipital scalp consistent with laceration. Minimal underlying scalp contusion. CT CERVICAL SPINE FINDINGS Alignment: Chronic loss of the normal cervical lordosis likely related to underlying degenerative disc disease. Skull base and vertebrae: No acute fracture. No primary bone lesion or focal pathologic process. Soft tissues and spinal canal: No prevertebral fluid or swelling. No visible canal hematoma. Disc levels: Multilevel degenerative disc disease throughout the entirety of the cervical spine but most significant in the mid to distal cervical spine. Stable mild (2-3 mm) anterolisthesis of C3 on C4. Stable mild retrolisthesis of C4 on  C5. Multilevel bilateral facet joint arthropathy without interval progression. Upper chest: Negative. Other: None IMPRESSION: CT HEAD 1. No acute intracranial abnormality. 2. Small occipital scalp contusion with overlying laceration. No evidence of skull fracture. 3. Stable atrophy, ventriculomegaly and moderately advanced chronic microvascular ischemic white matter disease. CT CSPINE 1. No acute fracture or malalignment. 2. Stable multilevel degenerative disc disease and bilateral facet joint arthropathy. Electronically Signed   By: Jacqulynn Cadet M.D.   On: 12/02/2017 10:52   Ct Cervical Spine Wo Contrast  Result Date: 12/02/2017 CLINICAL DATA:  76 year old male status post unwitnessed fall EXAM: CT HEAD WITHOUT CONTRAST CT CERVICAL SPINE WITHOUT CONTRAST TECHNIQUE:  Multidetector CT imaging of the head and cervical spine was performed following the standard protocol without intravenous contrast. Multiplanar CT image reconstructions of the cervical spine were also generated. COMPARISON:  Prior CT scan of the head and cervical spine 09/01/2015 FINDINGS: CT HEAD FINDINGS Brain: No evidence of acute infarction, hemorrhage, hydrocephalus, extra-axial collection or mass lesion/mass effect. Stable atrophy, ex vacuo ventriculomegaly and moderately advanced periventricular and subcortical white matter hypoattenuation most consistent with chronic ischemic white matter disease. Vascular: Mild calcification in the bilateral cavernous carotid arteries. No hyperdense vessel sign. Skull: Normal. Negative for fracture or focal lesion. Sinuses/Orbits: No acute finding. Other: Small soft tissue defect at the occipital scalp consistent with laceration. Minimal underlying scalp contusion. CT CERVICAL SPINE FINDINGS Alignment: Chronic loss of the normal cervical lordosis likely related to underlying degenerative disc disease. Skull base and vertebrae: No acute fracture. No primary bone lesion or focal pathologic process. Soft tissues and spinal canal: No prevertebral fluid or swelling. No visible canal hematoma. Disc levels: Multilevel degenerative disc disease throughout the entirety of the cervical spine but most significant in the mid to distal cervical spine. Stable mild (2-3 mm) anterolisthesis of C3 on C4. Stable mild retrolisthesis of C4 on C5. Multilevel bilateral facet joint arthropathy without interval progression. Upper chest: Negative. Other: None IMPRESSION: CT HEAD 1. No acute intracranial abnormality. 2. Small occipital scalp contusion with overlying laceration. No evidence of skull fracture. 3. Stable atrophy, ventriculomegaly and moderately advanced chronic microvascular ischemic white matter disease. CT CSPINE 1. No acute fracture or malalignment. 2. Stable multilevel degenerative  disc disease and bilateral facet joint arthropathy. Electronically Signed   By: Jacqulynn Cadet M.D.   On: 12/02/2017 10:52    Procedures .Marland KitchenLaceration Repair Date/Time: 12/02/2017 4:57 PM Performed by: Duffy Bruce, MD Authorized by: Duffy Bruce, MD   Consent:    Consent obtained:  Verbal   Consent given by:  Patient   Risks discussed:  Infection, need for additional repair, pain, tendon damage, retained foreign body, vascular damage, poor cosmetic result, poor wound healing and nerve damage   Alternatives discussed:  Referral and delayed treatment Anesthesia (see MAR for exact dosages):    Anesthesia method:  Local infiltration   Local anesthetic:  Lidocaine 1% WITH epi Laceration details:    Location:  Scalp   Length (cm):  1.5 Pre-procedure details:    Preparation:  Patient was prepped and draped in usual sterile fashion and imaging obtained to evaluate for foreign bodies Exploration:    Hemostasis achieved with:  Direct pressure   Wound exploration: wound explored through full range of motion and entire depth of wound probed and visualized   Treatment:    Area cleansed with:  Betadine   Amount of cleaning:  Extensive   Irrigation solution:  Sterile water   Irrigation method:  Pressure wash Skin repair:  Repair method:  Staples   Number of staples:  4 Approximation:    Approximation:  Close   Vermilion border: well-aligned   Post-procedure details:    Dressing:  Antibiotic ointment   Patient tolerance of procedure:  Tolerated well, no immediate complications   (including critical care time)  Medications Ordered in ED Medications  lidocaine-EPINEPHrine (XYLOCAINE W/EPI) 2 %-1:200000 (PF) injection 20 mL (20 mLs Intradermal Given 12/02/17 1211)     Initial Impression / Assessment and Plan / ED Course  I have reviewed the triage vital signs and the nursing notes.  Pertinent labs & imaging results that were available during my care of the patient were  reviewed by me and considered in my medical decision making (see chart for details).     76 year old male with past medical history as above including severe dementia here with unwitnessed, likely mechanical fall.  Patient is otherwise at his mental baseline.  He has a history of the same.  On exam, he has a small contusion and laceration which has been repaired.  He is otherwise hemodynamically stable.  He was monitored in the ED with no alteration in behavior.  No recent medication changes.  No arrhythmia on telemetry.  Final Clinical Impressions(s) / ED Diagnoses   Final diagnoses:  Contusion of occipital region of scalp, initial encounter    ED Discharge Orders        Ordered    bacitracin ointment  2 times daily     12/02/17 1228       Duffy Bruce, MD 12/02/17 1657

## 2018-03-26 DEATH — deceased
